# Patient Record
Sex: Female | Born: 1985 | Race: Black or African American | Hispanic: No | Marital: Married | Smoking: Never smoker
Health system: Southern US, Community
[De-identification: ages and names within clinical notes are randomized; demographics above are authoritative.]

## PROBLEM LIST (undated history)

## (undated) DIAGNOSIS — G43909 Migraine, unspecified, not intractable, without status migrainosus: Secondary | ICD-10-CM

## (undated) HISTORY — DX: Migraine, unspecified, not intractable, without status migrainosus: G43.909

## (undated) HISTORY — PX: NO PAST SURGERIES: SHX2092

---

## 2003-02-05 ENCOUNTER — Encounter: Admission: RE | Admit: 2003-02-05 | Discharge: 2003-02-05 | Payer: Self-pay | Admitting: Internal Medicine

## 2003-02-05 ENCOUNTER — Encounter: Payer: Self-pay | Admitting: Internal Medicine

## 2013-08-08 ENCOUNTER — Encounter: Payer: Self-pay | Admitting: Gynecology

## 2013-08-08 ENCOUNTER — Other Ambulatory Visit (HOSPITAL_COMMUNITY)
Admission: RE | Admit: 2013-08-08 | Discharge: 2013-08-08 | Disposition: A | Discharge status: Home / Self Care | Payer: BC Managed Care – PPO | Source: Ambulatory Visit | Attending: Gynecology | Admitting: Gynecology

## 2013-08-08 ENCOUNTER — Ambulatory Visit (INDEPENDENT_AMBULATORY_CARE_PROVIDER_SITE_OTHER): Payer: BC Managed Care – PPO | Admitting: Gynecology

## 2013-08-08 VITALS — BP 118/76 | Ht 63.5 in | Wt 159.0 lb

## 2013-08-08 DIAGNOSIS — Z309 Encounter for contraceptive management, unspecified: Secondary | ICD-10-CM

## 2013-08-08 DIAGNOSIS — R519 Headache, unspecified: Secondary | ICD-10-CM | POA: Insufficient documentation

## 2013-08-08 DIAGNOSIS — Z01419 Encounter for gynecological examination (general) (routine) without abnormal findings: Secondary | ICD-10-CM

## 2013-08-08 DIAGNOSIS — Z1151 Encounter for screening for human papillomavirus (HPV): Secondary | ICD-10-CM | POA: Insufficient documentation

## 2013-08-08 DIAGNOSIS — R51 Headache: Secondary | ICD-10-CM

## 2013-08-08 NOTE — Progress Notes (Signed)
Shirley Jennings 19-May-1985 [id number]   History:    28 y.o.  for annual gyn exam who is a new patient to the practice. The patient stated [facility] She states that she has always had normal Pap smears. She stated several years ago she completed the HPV vaccine series. She is sexually active. She is having normal menstrual cycle. She does suffer from migraines and has seen a neurologist. She infrequently does her self breast examination. Patient with no prior history of STDs. [facility] has been doing her blood work.  Past medical history,surgical history, family history and social history were all reviewed and documented in the EPIC chart.  Gynecologic History Patient's last menstrual period was 08/05/2013. Contraception: OCP (estrogen/progesterone) Last Pap: Over 3 years ago. Results were: normal Last mammogram: Not indicated. Results were: Not indicated  Obstetric History OB History  Gravida Para Term Preterm AB SAB TAB Ectopic Multiple Living  0                  ROS: A ROS was performed and pertinent positives and negatives are included in the history.  GENERAL: No fevers or chills. HEENT: No change in vision, no earache, sore throat or sinus congestion. NECK: No pain or stiffness. CARDIOVASCULAR: No chest pain or pressure. No palpitations. PULMONARY: No shortness of breath, cough or wheeze. GASTROINTESTINAL: No abdominal pain, nausea, vomiting or diarrhea, melena or bright red blood per rectum. GENITOURINARY: No urinary frequency, urgency, hesitancy or dysuria. MUSCULOSKELETAL: No joint or muscle pain, no back pain, no recent trauma. DERMATOLOGIC: No rash, no itching, no lesions. ENDOCRINE: No polyuria, polydipsia, no heat or cold intolerance. No recent change in weight. HEMATOLOGICAL: No anemia or easy bruising or bleeding. NEUROLOGIC: No headache, seizures, numbness, tingling or weakness. PSYCHIATRIC: No  depression, no loss of interest in normal activity or change in sleep pattern.     Exam: chaperone present  BP 118/76  Ht 5' 3.5" (1.613 m)  Wt 159 lb (72.122 kg)  BMI 27.72 kg/m2  LMP 08/05/2013  Body mass index is 27.72 kg/(m^2).  General appearance : Well developed well nourished female. No acute distress HEENT: Neck supple, trachea midline, no carotid bruits, no thyroidmegaly Lungs: Clear to auscultation, no rhonchi or wheezes, or rib retractions  Heart: Regular rate and rhythm, no murmurs or gallops Breast:Examined in sitting and supine position were symmetrical in appearance, no palpable masses or tenderness,  no skin retraction, no nipple inversion, no nipple discharge, no skin discoloration, no axillary or supraclavicular lymphadenopathy Abdomen: no palpable masses or tenderness, no rebound or guarding Extremities: no edema or skin discoloration or tenderness  Pelvic:  Bartholin, Urethra, Skene Glands: Within normal limits             Vagina: No gross lesions or discharge  Cervix: No gross lesions or discharge  Uterus  anteverted, normal size, shape and consistency, non-tender and mobile  Adnexa  Without masses or tenderness  Anus and perineum  normal   Rectovaginal  normal sphincter tone without palpated masses or tenderness             Hemoccult not indicated     Assessment/Plan:  28 y.o. 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 I have recommended that she come off the oral contraceptive pill to prevent stroke with her history of a severe migraines. We discussed alternative form of contraception such  as nonhormonal IUD like ParaGard T380A IUD for which the literature formation was provided. She will wait until her next cycle to start with the IUD meanwhile she will stay using barrier contraception such as a condom and come off the oral contraceptive pill. Her Pap smear was done today. The new guidelines were discussed in reference to Pap  smears every 3 years. Blood work will be drawn by her PCP.  Note: This dictation was prepared with  Dragon/digital dictation along withSmart phrase technology. Any transcriptional errors that result from this process are unintentional.   Ok Edwards MD, 3:53 PM 08/08/2013

## 2013-08-08 NOTE — Patient Instructions (Signed)
Intrauterine Device Insertion   Most often, an intrauterine device (IUD) is inserted into the uterus to prevent pregnancy. There are 2 types of IUDs available:  · Copper IUD This type of IUD creates an environment that is not favorable to sperm survival. The mechanism of action of the copper IUD is not known for certain. It can stay in place for 10 years.  · Hormone IUD This type of IUD contains the hormone progestin (synthetic progesterone). The progestin thickens the cervical mucus and prevents sperm from entering the uterus, and it also thins the uterine lining. There is no evidence that the hormone IUD prevents implantation. One hormone IUD can stay in place for up to 5 years, and a different hormone IUD can stay in place for up to 3 years.  An IUD is the most cost-effective birth control if left in place for the full duration. It may be removed at any time.  LET YOUR HEALTH CARE PROVIDER KNOW ABOUT:  · Any allergies you have.  · All medicines you are taking, including vitamins, herbs, eye drops, creams, and over-the-counter medicines.  · Previous problems you or members of your family have had with the use of anesthetics.  · Any blood disorders you have.  · Previous surgeries you have had.  · Possibility of pregnancy.  · Medical conditions you have.  RISKS AND COMPLICATIONS   Generally, intrauterine device insertion is a safe procedure. However, as with any procedure, complications can occur. Possible complications include:  · Accidental puncture (perforation) of the uterus.  · Accidental placement of the IUD either in the muscle layer of the uterus (myometrium) or outside the uterus. If this happens, the IUD can be found essentially floating around the bowels and must be taken out surgically.  · The IUD may fall out of the uterus (expulsion). This is more common in women who have recently had a child.    · Pregnancy in the fallopian tube (ectopic).  · Pelvic inflammatory disease (PID), which is infection of  the uterus and fallopian tubes. The risk of PID is slightly increased in the first 20 days after the IUD is placed, but the overall risk is still very low.  BEFORE THE PROCEDURE  · Schedule the IUD insertion for when you will have your menstrual period or right after, to make sure you are not pregnant. Placement of the IUD is better tolerated shortly after a menstrual cycle.  · You may need to take tests or be examined to make sure you are not pregnant.  · You may be required to take a pregnancy test.  · You may be required to get checked for sexually transmitted infections (STIs) prior to placement. Placing an IUD in someone who has an infection can make the infection worse.  · You may be given a pain reliever to take 1 or 2 hours before the procedure.  · An exam will be performed to determine the size and position of your uterus.  · Ask your health care provider about changing or stopping your regular medicines.  PROCEDURE   · A tool (speculum) is placed in the vagina. This allows your health care provider to see the lower part of the uterus (cervix).  · The cervix is prepped with a medicine that lowers the risk of infection.  · You may be given a medicine to numb each side of the cervix (intracervical or paracervical block). This is used to block and control any discomfort with insertion.  · A tool (  the cervical canal and into your uterus.  The IUD is placed in the uterine cavity and the insertion device is removed.  The nylon string that is attached to the IUD and used for eventual IUD removal is trimmed. It is trimmed so that it lays high in the vagina, just outside the cervix. AFTER THE PROCEDURE  You may have bleeding after the procedure. This is normal. It varies from light spotting for a few days to menstrual-like  bleeding.  You may have mild cramping. Document Released: 12/30/2010 Document Revised: 02/21/2013 Document Reviewed: 10/22/2012 [facility] Patient Information 2014 [place]. Breast Self-Awareness Practicing breast self-awareness may pick up problems early, prevent significant medical complications, and possibly save your life. By practicing breast self-awareness, you can become familiar with how your breasts look and feel and if your breasts are changing. This allows you to notice changes early. It can also offer you some reassurance that your breast health is good. One way to learn what is normal for your breasts and whether your breasts are changing is to do a breast self-exam. If you find a lump or something that was not present in the past, it is best to contact your caregiver right away. Other findings that should be evaluated by your caregiver include nipple discharge, especially if it is bloody; skin changes or reddening; areas where the skin seems to be pulled in (retracted); or new lumps and bumps. Breast pain is seldom associated with cancer (malignancy), but should also be evaluated by a caregiver. HOW TO PERFORM A BREAST SELF-EXAM The best time to examine your breasts is 5 7 days after your menstrual period is over. During menstruation, the breasts are lumpier, and it may be more difficult to pick up changes. If you do not menstruate, have reached menopause, or had your uterus removed (hysterectomy), you should examine your breasts at regular intervals, such as monthly. If you are breastfeeding, examine your breasts after a feeding or after using a breast pump. Breast implants do not decrease the risk for lumps or tumors, so continue to perform breast self-exams as recommended. Talk to your caregiver about how to determine the difference between the implant and breast tissue. Also, talk about the amount of pressure you should use during the exam. Over time, you will become more familiar  with the variations of your breasts and more comfortable with the exam. A breast self-exam requires you to remove all your clothes above the waist. 1. Look at your breasts and nipples. Stand in front of a mirror in a room with good lighting. With your hands on your hips, push your hands firmly downward. Look for a difference in shape, contour, and size from one breast to the other (asymmetry). Asymmetry includes puckers, dips, or bumps. Also, look for skin changes, such as reddened or scaly areas on the breasts. Look for nipple changes, such as discharge, dimpling, repositioning, or redness. 2. Carefully feel your breasts. This is best done either in the shower or tub while using soapy water or when flat on your back. Place the arm (on the side of the breast you are examining) above your head. Use the pads (not the fingertips) of your three middle fingers on your opposite hand to feel your breasts. Start in the underarm area and use  inch (2 cm) overlapping circles to feel your breast. Use 3 different levels of pressure (light, medium, and firm pressure) at each circle before moving to the next circle. The light pressure is needed to  feel the tissue closest to the skin. The medium pressure will help to feel breast tissue a little deeper, while the firm pressure is needed to feel the tissue close to the ribs. Continue the overlapping circles, moving downward over the breast until you feel your ribs below your breast. Then, move one finger-width towards the center of the body. Continue to use the  inch (2 cm) overlapping circles to feel your breast as you move slowly up toward the collar bone (clavicle) near the base of the neck. Continue the up and down exam using all 3 pressures until you reach the middle of the chest. Do this with each breast, carefully feeling for lumps or changes. 3.  Keep a written record with breast changes or normal findings for each breast. By writing this information down, you do not  need to depend only on memory for size, tenderness, or location. Write down where you are in your menstrual cycle, if you are still menstruating. Breast tissue can have some lumps or thick tissue. However, see your caregiver if you find anything that concerns you.  SEEK MEDICAL CARE IF:  You see a change in shape, contour, or size of your breasts or nipples.   You see skin changes, such as reddened or scaly areas on the breasts or nipples.   You have an unusual discharge from your nipples.   You feel a new lump or unusually thick areas.  Document Released: 05/03/2005 Document Revised: 04/19/2012 Document Reviewed: 08/18/2011 [facility] Patient Information 2014 [place], [place].   Kenna Gilbert

## 2013-08-13 ENCOUNTER — Telehealth: Payer: Self-pay | Admitting: Gynecology

## 2013-08-13 NOTE — Telephone Encounter (Signed)
08/13/13-[facility]aguard and insertion at [facility]%, no copay. She will get back to us if she decides to proceed with this. [facility]

## 2013-12-17 LAB — OB RESULTS CONSOLE HIV ANTIBODY (ROUTINE TESTING): HIV: NONREACTIVE

## 2013-12-17 LAB — OB RESULTS CONSOLE ABO/RH: RH Type: POSITIVE

## 2013-12-17 LAB — OB RESULTS CONSOLE RPR: RPR: NONREACTIVE

## 2013-12-17 LAB — OB RESULTS CONSOLE HEPATITIS B SURFACE ANTIGEN: Hepatitis B Surface Ag: NEGATIVE

## 2013-12-17 LAB — OB RESULTS CONSOLE GC/CHLAMYDIA
CHLAMYDIA, DNA PROBE: NEGATIVE
Gonorrhea: NEGATIVE

## 2013-12-17 LAB — OB RESULTS CONSOLE ANTIBODY SCREEN: Antibody Screen: NEGATIVE

## 2013-12-17 LAB — OB RESULTS CONSOLE RUBELLA ANTIBODY, IGM: Rubella: IMMUNE

## 2014-02-23 ENCOUNTER — Telehealth: Payer: Self-pay | Admitting: Obstetrics and Gynecology

## 2014-02-23 NOTE — Telephone Encounter (Signed)
TC from patient:  18 weeks, was sleeping, got up with full bladder, had sharp pain just above pubis symphysis. Able to void without pain, and pain resolved.  No bleeding, d/c, or any other issue.  Likely due to bladder spasm or round ligament spasm.  Several questions reviewed, including safety of sex during labor, etc.  Patient to push fluids, rest, and call with any recurrence of pain.  Nigel BridgemanVicki Neyland Pettengill, CNM 02/23/14 5P

## 2014-05-04 ENCOUNTER — Telehealth: Payer: Self-pay

## 2014-05-04 NOTE — Telephone Encounter (Signed)
Patient calls reporting N/V and diarrhea since this morning and feels she may have a stomach virus or food poisoning.  Patient reports seating brown rice, honey chicken, and egg wontons from [employer] for dinner yesterday.  Patient had no issues until waking at 0330.  Patient states, at this time, it was "coming out of both ends."  Patient states that she stopped vomiting, but continues to have diarrhea.  Patient denies fever, and chills.  Patient reports nausea and has not attempted any OTC medications.  Patient does report having some zofran from 1st trimester.  Patient instructed to take zofran, continue to hydrate with water and/or gatorade, and attempt to eat crackers/toast.  Patient instructed to gradually advance diet and report back if diarrhea worsens, vomiting resumes, or if other symptoms present.  Patient informed that diarrhea should be resolved in 24 hours and if symptoms remain tomorrow around lunch then to call for possible evaluation.  Patient verbalized understanding and had no other questions or concerns.

## 2014-07-05 LAB — OB RESULTS CONSOLE GBS: GBS: NEGATIVE

## 2014-07-15 ENCOUNTER — Encounter (HOSPITAL_COMMUNITY): Payer: Self-pay | Admitting: *Deleted

## 2014-07-15 ENCOUNTER — Other Ambulatory Visit: Payer: Self-pay | Admitting: Obstetrics and Gynecology

## 2014-07-15 ENCOUNTER — Inpatient Hospital Stay (HOSPITAL_COMMUNITY)
Admission: AD | Admit: 2014-07-15 | Discharge: 2014-07-20 | DRG: 765 | Disposition: A | Discharge status: Home / Self Care | Payer: BC Managed Care – PPO | Source: Ambulatory Visit | Attending: Obstetrics and Gynecology | Admitting: Obstetrics and Gynecology

## 2014-07-15 ENCOUNTER — Inpatient Hospital Stay (HOSPITAL_COMMUNITY): Payer: BC Managed Care – PPO

## 2014-07-15 DIAGNOSIS — IMO0001 Reserved for inherently not codable concepts without codable children: Secondary | ICD-10-CM

## 2014-07-15 DIAGNOSIS — O99354 Diseases of the nervous system complicating childbirth: Secondary | ICD-10-CM | POA: Diagnosis present

## 2014-07-15 DIAGNOSIS — O9962 Diseases of the digestive system complicating childbirth: Secondary | ICD-10-CM | POA: Diagnosis present

## 2014-07-15 DIAGNOSIS — R030 Elevated blood-pressure reading, without diagnosis of hypertension: Secondary | ICD-10-CM

## 2014-07-15 DIAGNOSIS — E876 Hypokalemia: Secondary | ICD-10-CM | POA: Diagnosis present

## 2014-07-15 DIAGNOSIS — O139 Gestational [pregnancy-induced] hypertension without significant proteinuria, unspecified trimester: Secondary | ICD-10-CM | POA: Diagnosis present

## 2014-07-15 DIAGNOSIS — O99284 Endocrine, nutritional and metabolic diseases complicating childbirth: Secondary | ICD-10-CM | POA: Diagnosis present

## 2014-07-15 DIAGNOSIS — K117 Disturbances of salivary secretion: Secondary | ICD-10-CM | POA: Diagnosis present

## 2014-07-15 DIAGNOSIS — G43909 Migraine, unspecified, not intractable, without status migrainosus: Secondary | ICD-10-CM | POA: Diagnosis present

## 2014-07-15 DIAGNOSIS — O133 Gestational [pregnancy-induced] hypertension without significant proteinuria, third trimester: Secondary | ICD-10-CM | POA: Diagnosis present

## 2014-07-15 DIAGNOSIS — Z3A39 39 weeks gestation of pregnancy: Secondary | ICD-10-CM | POA: Diagnosis present

## 2014-07-15 DIAGNOSIS — Z8669 Personal history of other diseases of the nervous system and sense organs: Secondary | ICD-10-CM

## 2014-07-15 DIAGNOSIS — Z98891 History of uterine scar from previous surgery: Secondary | ICD-10-CM

## 2014-07-15 LAB — URINALYSIS, ROUTINE W REFLEX MICROSCOPIC
BILIRUBIN URINE: NEGATIVE
Glucose, UA: NEGATIVE mg/dL
Hgb urine dipstick: NEGATIVE
KETONES UR: NEGATIVE mg/dL
Leukocytes, UA: NEGATIVE
Nitrite: NEGATIVE
PH: 6 (ref 5.0–8.0)
PROTEIN: NEGATIVE mg/dL
Urobilinogen, UA: 0.2 mg/dL (ref 0.0–1.0)

## 2014-07-15 LAB — COMPREHENSIVE METABOLIC PANEL
ALT: 29 U/L (ref 0–35)
AST: 39 U/L — AB (ref 0–37)
Albumin: 2.9 g/dL — ABNORMAL LOW (ref 3.5–5.2)
Alkaline Phosphatase: 188 U/L — ABNORMAL HIGH (ref 39–117)
Anion gap: 4 — ABNORMAL LOW (ref 5–15)
BILIRUBIN TOTAL: 0.5 mg/dL (ref 0.3–1.2)
BUN: 5 mg/dL — ABNORMAL LOW (ref 6–23)
CALCIUM: 8.5 mg/dL (ref 8.4–10.5)
CHLORIDE: 108 mmol/L (ref 96–112)
CO2: 24 mmol/L (ref 19–32)
Creatinine, Ser: 0.58 mg/dL (ref 0.50–1.10)
GFR calc Af Amer: >90 mL/min (ref >90)
GFR calc non Af Amer: >90 mL/min (ref >90)
Glucose, Bld: 88 mg/dL (ref 70–99)
Potassium: 2.9 mmol/L — ABNORMAL LOW (ref 3.5–5.1)
SODIUM: 136 mmol/L (ref 135–145)
Total Protein: 6.1 g/dL (ref 6.0–8.3)

## 2014-07-15 LAB — CBC
HCT: 33.4 % — ABNORMAL LOW (ref 36.0–46.0)
HEMOGLOBIN: 11.3 g/dL — AB (ref 12.0–15.0)
MCH: 30.3 pg (ref 26.0–34.0)
MCHC: 33.8 g/dL (ref 30.0–36.0)
MCV: 89.5 fL (ref 78.0–100.0)
Platelets: 192 10*3/uL (ref 150–400)
RBC: 3.73 MIL/uL — AB (ref 3.87–5.11)
RDW: 14 % (ref 11.5–15.5)
WBC: 10.7 10*3/uL — ABNORMAL HIGH (ref 4.0–10.5)

## 2014-07-15 LAB — TYPE AND SCREEN
ABO/RH(D): O POS
Antibody Screen: NEGATIVE

## 2014-07-15 LAB — PROTEIN / CREATININE RATIO, URINE
Creatinine, Urine: 43 mg/dL
Protein Creatinine Ratio: 0.16 — ABNORMAL HIGH (ref 0.00–0.15)
Total Protein, Urine: 7 mg/dL

## 2014-07-15 MED ORDER — ONDANSETRON HCL 4 MG/2ML IJ SOLN: 4.0000 mg | Freq: Four times a day (QID) | INTRAMUSCULAR | Status: DC | PRN

## 2014-07-15 MED ORDER — TERBUTALINE SULFATE 1 MG/ML IJ SOLN: 0.2500 mg | Freq: Once | INTRAMUSCULAR | Status: AC | PRN

## 2014-07-15 MED ORDER — OXYTOCIN BOLUS FROM INFUSION: 500.0000 mL | INTRAVENOUS | Status: DC

## 2014-07-15 MED ORDER — CITRIC ACID-SODIUM CITRATE 334-500 MG/5ML PO SOLN
30.0000 mL | ORAL | Status: DC | PRN
  Administered 2014-07-18: 30 mL via ORAL
  Filled 2014-07-15: qty 15

## 2014-07-15 MED ORDER — LACTATED RINGERS IV SOLN
INTRAVENOUS | Status: DC
  Administered 2014-07-15 – 2014-07-18 (×4): via INTRAVENOUS

## 2014-07-15 MED ORDER — OXYCODONE-ACETAMINOPHEN 5-325 MG PO TABS: 2.0000 | ORAL_TABLET | ORAL | Status: DC | PRN

## 2014-07-15 MED ORDER — OXYCODONE-ACETAMINOPHEN 5-325 MG PO TABS: 1.0000 | ORAL_TABLET | ORAL | Status: DC | PRN

## 2014-07-15 MED ORDER — ACETAMINOPHEN 325 MG PO TABS: 650.0000 mg | ORAL_TABLET | ORAL | Status: DC | PRN

## 2014-07-15 MED ORDER — MISOPROSTOL 25 MCG QUARTER TABLET
25.0000 ug | ORAL_TABLET | ORAL | Status: DC | PRN
  Administered 2014-07-15 – 2014-07-17 (×4): 25 ug via VAGINAL
  Filled 2014-07-15 (×5): qty 0.25

## 2014-07-15 MED ORDER — OXYTOCIN 40 UNITS IN LACTATED RINGERS INFUSION - SIMPLE MED
62.5000 mL/h | INTRAVENOUS | Status: DC
  Filled 2014-07-15: qty 1000

## 2014-07-15 MED ORDER — BUTORPHANOL TARTRATE 1 MG/ML IJ SOLN
1.0000 mg | INTRAMUSCULAR | Status: DC | PRN
  Administered 2014-07-17 (×2): 1 mg via INTRAVENOUS
  Filled 2014-07-15 (×2): qty 1

## 2014-07-15 MED ORDER — LIDOCAINE HCL (PF) 1 % IJ SOLN: 30.0000 mL | INTRAMUSCULAR | Status: DC | PRN

## 2014-07-15 MED ORDER — LACTATED RINGERS IV SOLN
500.0000 mL | INTRAVENOUS | Status: DC | PRN
  Administered 2014-07-18: 500 mL via INTRAVENOUS

## 2014-07-15 NOTE — Progress Notes (Signed)
Urine PCR 0.16. No need for Magnesium Sulfate at this time.  Shirley ScarletKimberly Quinnley Jennings, CNM 07/15/14, 10:00 PM

## 2014-07-15 NOTE — MAU Provider Note (Signed)
History     CSN: [id number]  Arrival date and time: 07/15/14 1754   First Provider Initiated Contact with Patient 07/15/14 1904      Chief Complaint  Patient presents with  . Hypertension   HPI  Shirley Jennings is a 29 y.o. G1P0 at [id number] who presents today from the office with elevated and increase swelling in her right leg. She states that she started to have elevated blood pressures on Thursday in the office. She states that they were again elevated today. She denies any HA, visual disturbances or RUQ pain. She states that the fetus has been active.   Last drank  at 1600 Last ate salad with dressing at 1330  Past Medical History  Diagnosis Date  . Migraines     Past Surgical History  Procedure Laterality Date  . No past surgeries      Family History  Problem Relation Age of Onset  . Diabetes Maternal Grandmother   . Hypertension Maternal Grandfather     History  Substance Use Topics  . Smoking status: Never Smoker   . Smokeless tobacco: Not on file  . Alcohol Use: No     Comment: WINE    Allergies: No Known Allergies  Prescriptions prior to admission  Medication Sig Dispense Refill Last Dose  . butalbital-aspirin-caffeine (FIORINAL) 50-325-40 MG per capsule Take 1 capsule by mouth 2 (two) times daily as needed for headache.   Taking  . norethindrone-ethinyl estradiol (JUNEL FE,GILDESS FE,LOESTRIN FE) 1-20 MG-MCG tablet Take 1 tablet by mouth daily.   Not Taking    ROS Physical Exam   Blood pressure 143/87, pulse 84, temperature 98.5 F (36.9 C), temperature source Oral, resp. rate 18, last menstrual period 08/05/2013.  Physical Exam  Constitutional: She is oriented to person, place, and time. She appears well-developed and well-nourished. No distress.  Cardiovascular: Normal rate.   Respiratory: Effort normal.  GI: Soft. There is no tenderness.  Musculoskeletal: She exhibits edema. She exhibits no tenderness.  R: 42.5 cm, edema, non-tender, no  erythema. Negative homans  L: 39.5 cm, edema, non-tender, no erythema, negative homans.    Neurological: She is alert and oriented to person, place, and time. She has normal reflexes.  No clonus   Skin: Skin is warm and dry.  Psychiatric: She has a normal mood and affect.    MAU Course  Procedures  Results for orders placed or performed during the hospital encounter of 07/15/14 (from the past 24 hour(s))  Urinalysis, Routine w reflex microscopic     Status: Abnormal   Collection Time: 07/15/14  6:11 PM  Result Value Ref Range   Color, Urine STRAW (A) YELLOW   APPearance HAZY (A) CLEAR   Specific Gravity, Urine <1.005 (L) 1.005 - 1.030   pH 6.0 5.0 - 8.0   Glucose, UA NEGATIVE NEGATIVE mg/dL   Hgb urine dipstick NEGATIVE NEGATIVE   Bilirubin Urine NEGATIVE NEGATIVE   Ketones, ur NEGATIVE NEGATIVE mg/dL   Protein, ur NEGATIVE NEGATIVE mg/dL   Urobilinogen, UA 0.2 0.0 - 1.0 mg/dL   Nitrite NEGATIVE NEGATIVE   Leukocytes, UA NEGATIVE NEGATIVE  CBC     Status: Abnormal   Collection Time: 07/15/14  6:37 PM  Result Value Ref Range   WBC 10.7 (H) 4.0 - 10.5 K/uL   RBC 3.73 (L) 3.87 - 5.11 MIL/uL   Hemoglobin 11.3 (L) 12.0 - 15.0 g/dL   HCT 40.933.4 (L) 81.136.0 - 91.446.0 %   MCV 89.5 78.0 - 100.0 fL  MCH 30.3 26.0 - 34.0 pg   MCHC 33.8 30.0 - 36.0 g/dL   RDW 16.1 09.6 - 04.5 %   Platelets 192 150 - 400 K/uL  Comprehensive metabolic panel     Status: Abnormal   Collection Time: 07/15/14  6:37 PM  Result Value Ref Range   Sodium 136 135 - 145 mmol/L   Potassium 2.9 (L) 3.5 - 5.1 mmol/L   Chloride 108 96 - 112 mmol/L   CO2 24 19 - 32 mmol/L   Glucose, Bld 88 70 - 99 mg/dL   BUN 5 (L) 6 - 23 mg/dL   Creatinine, Ser 4.09 0.50 - 1.10 mg/dL   Calcium 8.5 8.4 - 81.1 mg/dL   Total Protein 6.1 6.0 - 8.3 g/dL   Albumin 2.9 (L) 3.5 - 5.2 g/dL   AST 39 (H) 0 - 37 U/L   ALT 29 0 - 35 U/L   Alkaline Phosphatase 188 (H) 39 - 117 U/L   Total Bilirubin 0.5 0.3 - 1.2 mg/dL   GFR calc non Af Amer  >90 >90 mL/min   GFR calc Af Amer >90 >90 mL/min   Anion gap 4 (L) 5 - 15    1920: Dr. Richardson Dopp on the phone with the RN. She will admit the patient.  Assessment and Plan  Pre-eclampsia Admit to labor and delivery.   Tawnya Crook 07/15/2014, 7:06 PM

## 2014-07-15 NOTE — Progress Notes (Signed)
Patient sent from office for further evaluation due to elevated bp in the office. Her bp in MAU has been as high as 160/107... Given severe range pressures at term will keep for induction.. Plan for cytotec induction overnight.. Plan of care was discussed with the patient and all her questions were answered.. Sign out given to Dr. Dois DavenportSandra Rivard who is covering until 700 am on 07/16/2014.

## 2014-07-15 NOTE — MAU Note (Signed)
snt from office, BP was elevated on Thur, f/u today- still elevated. Denies HA, epigastric pain or visual disturbances.  Increased swelling in lower extr, esp right leg, tight and tender to touch, hurts to walk on it.

## 2014-07-15 NOTE — H&P (Signed)
Zenon MayoSantana T Miller is a 29 y.o. female G1 at 6738 4/7 weeks c/w 10 week ultrasound for a f/u ob visit due to elevated BP.Pt was seen 4 days ago, BP was 136/92, repeat 119/74.  Pt was asymptomatic at that time.  Labs were ordered but pt did not go to lab to have them done.  First BP today was 158/96, repeat 142/96.  Pt denies headaches, SOB, visual changes.  She has c/o right leg pain which thought was due to swelling.  The right leg has been more swollen than the left leg throughout the pregnancy but it recently increased in swelling and has become uncomfortable.   [facility]Dr. Dion BodyVarnado) at 9 weeks.  Pt's pregnancy has been complicated by Nausea/vomiting of pregnancy, ptyallism, H/o migraine headaches.  Pt declined Influenza and Tdap vaccination.  Maternal Medical History:  Reason for admission: Elevated BP  Contractions: Frequency: irregular.   Perceived severity is mild.    Fetal activity: Perceived fetal activity is normal.    Prenatal complications: no prenatal complications Prenatal Complications - Diabetes: none.    OB History    Gravida Para Term Preterm AB TAB SAB Ectopic Multiple Living   0              Past Medical History  Diagnosis Date  . Migraines    No past surgical history on file. Family History: family history includes Diabetes in her maternal grandmother; Hypertension in her maternal grandfather. Social History:  reports that she has never smoked. She does not have any smokeless tobacco history on file. She reports that she drinks alcohol. Her drug history is not on file.   Prenatal Transfer Tool  Maternal Diabetes: No Genetic Screening: Normal Maternal Ultrasounds/Referrals: Normal Fetal Ultrasounds or other Referrals:  None Maternal Substance Abuse:  No Significant Maternal Medications:  None Significant Maternal Lab Results:  Lab values include: Group B Strep negative Other Comments:  Elevated BP-Gest HTN vs. Preeclampsia  Review of  Systems  Eyes:       Neg visual changes.  Respiratory: Negative for shortness of breath.   Cardiovascular: Negative for chest pain.  Musculoskeletal:       Right leg pain, increased swelling  Neurological: Positive for speech change. Negative for headaches.      There were no vitals taken for this visit. Maternal Exam:  Abdomen: Patient reports no abdominal tenderness. Fundal height is 39 cm.   Fetal presentation: vertex  Introitus: Normal vulva. Ferning test: not done.  2 hyperpigmented lesions on right inner thigh, slightly raised  Pelvis: adequate for delivery.   Cervix: Cervix evaluated by digital exam.     Fetal Exam Fetal Monitor Review: Mode: hand-held doppler probe.   Baseline rate: 140s.      Physical Exam  Constitutional: She is oriented to person, place, and time. She appears well-developed and well-nourished. No distress.  HENT:  Head: Atraumatic.  Neck: Normal range of motion.  Cardiovascular: Normal rate.   Respiratory: Effort normal and breath sounds normal.  GI: There is no tenderness.  No RUQ pain  Musculoskeletal: Normal range of motion. She exhibits edema.  Subjective discomfort but no specific calf tenderness.  Neg Homen's sign.  Neurological: She is alert and oriented to person, place, and time.  Skin: Skin is warm and dry.  Right leg taut, increased sheen of leg  Psychiatric: She has a normal mood and affect.    Prenatal labs: ABO, Rh:   Antibody:   Rubella:  RPR:    HBsAg:    HIV:    GBS:   Negative  Assessment/Plan: IUP at 38 4/7 weeks Elevated BP.  Gestational HTN vs. Preeclampsia Right leg pain, edema greater than left and now pain.  H/o trauma in the right leg.  Low-moderate suspicion for DVT.  Sent to MAU for serial BP, labs, NST, ultrasound for EFW, AFI. If any testing abnormal, pt will stay for induction of labor.  Cervix closed, would start with Cytotec for cervical ripening. If BP normalizes, testing reassuring, fetal  status reassuring, IOL tomorrow night. LE doppler to rule out DVT.    Dr. Richardson Dopp on until 7 pm. Dr. Estanislado Pandy on call from 7pm to 7am. Low threshold to admit pt. Diagnosis, work up and management plan d/w pt.  Informed of possible induction tonight.  Geryl Rankins 07/15/2014, 5:49 PM

## 2014-07-15 NOTE — Progress Notes (Signed)
*  Preliminary Results* Right lower extremity venous duplex completed. Right lower extremity is negative for deep vein thrombosis. There is no evidence of right Baker's cyst.  07/15/2014 7:45 PM  Gertie FeyMichelle Kanon Novosel, RVT, RDCS, RDMS

## 2014-07-16 LAB — WET PREP, GENITAL
Clue Cells Wet Prep HPF POC: NONE SEEN
TRICH WET PREP: NONE SEEN
Yeast Wet Prep HPF POC: NONE SEEN

## 2014-07-16 LAB — COMPREHENSIVE METABOLIC PANEL
ALK PHOS: 183 U/L — AB (ref 39–117)
ALT: 28 U/L (ref 0–35)
AST: 38 U/L — ABNORMAL HIGH (ref 0–37)
Albumin: 2.7 g/dL — ABNORMAL LOW (ref 3.5–5.2)
Anion gap: 6 (ref 5–15)
BUN: <5 mg/dL — ABNORMAL LOW (ref 6–23)
CO2: 23 mmol/L (ref 19–32)
Calcium: 8.6 mg/dL (ref 8.4–10.5)
Chloride: 109 mmol/L (ref 96–112)
Creatinine, Ser: 0.58 mg/dL (ref 0.50–1.10)
GFR calc Af Amer: >90 mL/min (ref >90)
GFR calc non Af Amer: >90 mL/min (ref >90)
Glucose, Bld: 72 mg/dL (ref 70–99)
POTASSIUM: 3.2 mmol/L — AB (ref 3.5–5.1)
SODIUM: 138 mmol/L (ref 135–145)
Total Bilirubin: 0.6 mg/dL (ref 0.3–1.2)
Total Protein: 6.2 g/dL (ref 6.0–8.3)

## 2014-07-16 LAB — CBC
HCT: 31.7 % — ABNORMAL LOW (ref 36.0–46.0)
HEMOGLOBIN: 10.7 g/dL — AB (ref 12.0–15.0)
MCH: 30.3 pg (ref 26.0–34.0)
MCHC: 33.8 g/dL (ref 30.0–36.0)
MCV: 89.8 fL (ref 78.0–100.0)
Platelets: 188 10*3/uL (ref 150–400)
RBC: 3.53 MIL/uL — ABNORMAL LOW (ref 3.87–5.11)
RDW: 14.1 % (ref 11.5–15.5)
WBC: 10.7 10*3/uL — ABNORMAL HIGH (ref 4.0–10.5)

## 2014-07-16 LAB — URINALYSIS, DIPSTICK ONLY
BILIRUBIN URINE: NEGATIVE
Glucose, UA: NEGATIVE mg/dL
Hgb urine dipstick: NEGATIVE
Ketones, ur: NEGATIVE mg/dL
LEUKOCYTES UA: NEGATIVE
NITRITE: NEGATIVE
PH: 7 (ref 5.0–8.0)
Protein, ur: NEGATIVE mg/dL
Urobilinogen, UA: 0.2 mg/dL (ref 0.0–1.0)

## 2014-07-16 LAB — ABO/RH: ABO/RH(D): O POS

## 2014-07-16 LAB — RPR: RPR Ser Ql: NONREACTIVE

## 2014-07-16 MED ORDER — POTASSIUM CHLORIDE CRYS ER 20 MEQ PO TBCR
20.0000 meq | EXTENDED_RELEASE_TABLET | Freq: Four times a day (QID) | ORAL | Status: AC
  Administered 2014-07-16: 20 meq via ORAL
  Filled 2014-07-16 (×4): qty 1

## 2014-07-16 MED ORDER — TERBUTALINE SULFATE 1 MG/ML IJ SOLN: 0.2500 mg | Freq: Once | INTRAMUSCULAR | Status: AC | PRN

## 2014-07-16 MED ORDER — ZOLPIDEM TARTRATE 5 MG PO TABS
5.0000 mg | ORAL_TABLET | Freq: Every evening | ORAL | Status: DC | PRN
  Administered 2014-07-16: 5 mg via ORAL
  Filled 2014-07-16: qty 1

## 2014-07-16 MED ORDER — OXYTOCIN 40 UNITS IN LACTATED RINGERS INFUSION - SIMPLE MED
1.0000 m[IU]/min | INTRAVENOUS | Status: DC
  Administered 2014-07-16: 1 m[IU]/min via INTRAVENOUS

## 2014-07-16 NOTE — Progress Notes (Signed)
Subjective: Report received from Katharine LookJ. Ozan, MD.  Reports patient is a 28y.o. G1P0 at 38.5wks for IOL d/t elevated bp.  Patient receiving 2nd round of cytotec for cervical ripening.  Strip and Chart Reviewed.   Objective:  Filed Vitals:   07/16/14 1831 07/16/14 1834 07/16/14 1853 07/16/14 2323  BP: 131/107 174/84 150/90 155/76  Pulse: 77 67 89 78  Temp:    98.4 F (36.9 C)  TempSrc:    Oral  Resp: 20  20 18   Height:      Weight:      SpO2:        FHR: 135 bpm, Min Var,- Decels, -Accels UC: Occasional  Assessment: IUP at 338w5dwks Cat I FT Cervical Ripening Elevated BP  Plan: Nurse instructed to place cytotec Okay for ambien for sleep aide Will continue to monitor Continue other mgmt as ordered  Sabas SousJ. Jasa Dundon, CNM 07/16/2014 11:56 PM

## 2014-07-16 NOTE — Progress Notes (Signed)
Informed Shirley Jennings CNM Patient contracting too Frequently to place Cytotec at 4 hour interval. Advised by [name] to place another Cytotec if contractions space out.

## 2014-07-16 NOTE — Progress Notes (Addendum)
Shirley Jennings is a 29 y.o. G1P0 at 6954w5d  Subjective: Pt with c/o frequent urination, every 20 minutes.  Improved after saline lock.  Right leg feels better if she is up moving.  RN thinks swelling has decreased in the right leg. Denies headache, visual changes.  Starting to feel contractions but not painful.  Objective: BP 140/79 mmHg  Pulse 65  Temp(Src) 98 F (36.7 C) (Oral)  Resp 18  Ht 5\' 3"  (1.6 m)  Wt 81.647 kg (180 lb)  BMI 31.89 kg/m2  SpO2 99%  LMP 08/05/2013     Gen:  NAD, resting quietly, ambulating well Abd:  7 1/2 lbs, vertex Ext:  Swelling of right leg to upper right thigh, no calf tenderness or warmth. Neuro:  Reflexes not illicited.  FHT:  FHR: 140s bpm, variability: moderate,  accelerations:  Present,  decelerations:  Absent UC:   regular, every 2 minutes SVE:   Dilation: Closed Effacement (%): 70 Station: -1 Exam by:: Dr Rico JunkerVarnado Minima change from office, slightly softer.   Labs: Lab Results  Component Value Date   WBC 10.7* 07/15/2014   HGB 11.3* 07/15/2014   HCT 33.4* 07/15/2014   MCV 89.5 07/15/2014   PLT 192 07/15/2014  CMP- AST slightly elevated, K+ 2.9 Pr/Cr ratio 0.16 US- 7 lb 2 oz +/- 17 oz, AFI 7.8 LE Doppler neg for DVT  Assessment / Plan: IUP at 38 5/[redacted] weeks Gestational HTN  BPs now mildly elevated. S/p Cytotec x 1.  Frequent contractions, palpate moderate. Right leg edema, Pain decreasing. Neg DVT. Hypokalemia- Start Kdur 20 meq q 6.  Labor: Observe contractions 1 hour, pt just emptied bladder.  Start low dose Pitocin.  Place Foley bulb this afternoon if cervix opens. Preeclampsia:  No s/sxs of preeclampsia Fetal Wellbeing:  Category I Pain Control:  Labor support without medications I/D:  n/a Anticipated MOD:  NSVD  Shirley Jennings 07/16/2014, 7:16 AM

## 2014-07-16 NOTE — Progress Notes (Signed)
Raelyn NumberSantana T Kortz is a 29 y.o. G1P0 at 4747w5d  Subjective: Pt comfortable.  Feels contractions, slightly stronger.  Denies LOF or VB.   Pt feels very irritated during checks.  Pt had 2 yeast infections antepartum.  Objective: BP 149/90 mmHg  Pulse 77  Temp(Src) 97.7 F (36.5 C) (Oral)  Resp 20  Ht 5\' 3"  (1.6 m)  Wt 81.647 kg (180 lb)  BMI 31.89 kg/m2  SpO2 99%  LMP 08/05/2013      FHT:  Reactive, good variability, no decels UC:   irregular, every 1-3 minutes SVE:   FT/thin/-3, posterior Neuro:  Not illicited. Attempted foley bulb placement with sterile technique but cervix was very posterior.  Os not well viisualized.  White discharge. Labs: Lab Results  Component Value Date   WBC 10.7* 07/15/2014   HGB 11.3* 07/15/2014   HCT 33.4* 07/15/2014   MCV 89.5 07/15/2014   PLT 192 07/15/2014    Assessment / Plan: IUP at 38 5/7 weeks IOL due to Gest HTN  BPs mild to severe range.  Failed foley bulb placement. Right leg edema-Neg lower extremity doppler Hypokalemia on Kdur.   Labor: Contractions with minimal cervical change Preeclampsia:  Repeat labs as BP is trending up.  CBC, CMP, Urine dip for protein. Fetal Wellbeing:  Category I Pain Control:  None required I/D:  n/a Anticipated MOD:  NSVD  Wet prep sent to check for yeast.  Increase pitocin.  Recheck at ~7 pm.  If no change, stop Pitocin, allow pt to eat and attempt Cytotec overnight.  Dr. Charlotta Newtonzan on until 7 pm.  Geryl RankinsVARNADO, Linsey Hirota 07/16/2014, 3:33 PM

## 2014-07-16 NOTE — Progress Notes (Signed)
Shirley Jennings is a 29 y.o. G1P0 at 5035w5d  Subjective: Resting comfortably in bed, no acute complaints.  No headache, no blurry vision, no RUQ pain.  Contractions remain minimal. Objective: BP 131/107 mmHg  Pulse 77  Temp(Src) 98.7 F (37.1 C) (Oral)  Resp 18  Ht 5\' 3"  (1.6 m)  Wt 81.647 kg (180 lb)  BMI 31.89 kg/m2  SpO2 99%  LMP 08/05/2013  BP range: 124-168/68-109 Total I/O In: 262.9 [I.V.:262.9] Out: 500 [Urine:500]  FHT:  140, moderate variability, +accels no decel UC:   irregular, every 1-3 minutes SVE:   FT/90/-3, posterior Ext: 2+ edema R>L, no clonus  Labs:   CBC Latest Ref Rng 07/16/2014 07/15/2014  WBC 4.0 - 10.5 K/uL 10.7(H) 10.7(H)  Hemoglobin 12.0 - 15.0 g/dL 10.7(L) 11.3(L)  Hematocrit 36.0 - 46.0 % 31.7(L) 33.4(L)  Platelets 150 - 400 K/uL 188 192    Results for Shirley NumberBOYD, Kiarah T (MRN [id number]) as of 07/16/2014 18:34  Ref. Range 07/16/2014 16:35  Sodium Latest Range: 135-145 mmol/L 138  Potassium Latest Range: 3.5-5.1 mmol/L 3.2 (L)  Chloride Latest Range: 96-112 mmol/L 109  CO2 Latest Range: 19-32 mmol/L 23  BUN Latest Range: 6-23 mg/dL <5 (L)  Creatinine Latest Range: 0.50-1.10 mg/dL 5.620.58  Calcium Latest Range: 8.4-10.5 mg/dL 8.6  GFR calc non Af Amer Latest Range: >90 mL/min >90  GFR calc Af Amer Latest Range: >90 mL/min >90  Glucose Latest Range: 70-99 mg/dL 72  Anion gap Latest Range: 5-15  6  Alkaline Phosphatase Latest Range: 39-117 U/L 183 (H)  Albumin Latest Range: 3.5-5.2 g/dL 2.7 (L)  AST Latest Range: 0-37 U/L 38 (H)  ALT Latest Range: 0-35 U/L 28  Total Protein Latest Range: 6.0-8.3 g/dL 6.2  Total Bilirubin Latest Range: 0.3-1.2 mg/dL 0.6  WBC Latest Range: 4.0-10.5 K/uL 10.7 (H)  RBC Latest Range: 3.87-5.11 MIL/uL 3.53 (L)  Hemoglobin Latest Range: 12.0-15.0 g/dL 13.010.7 (L)  HCT Latest Range: 36.0-46.0 % 31.7 (L)  MCV Latest Range: 78.0-100.0 fL 89.8  MCH Latest Range: 26.0-34.0 pg 30.3  MCHC Latest Range: 30.0-36.0 g/dL 86.533.8  RDW Latest  Range: 11.5-15.5 % 14.1  Platelets Latest Range: 150-400 K/uL 188    Assessment / Plan: 28yo G1P0@[redacted]w[redacted]d  admitted for IOL due to gestational HTN  FWB- Cat. I Labor: No further dilation, will discontinue Pitocin.  Pt to shower and eat and plan for cytotec per protocol this evening Gestational HTN- BP variable, but no IV medications indicated.  Labs remain stable, no evidence of preeclampsia.  Will continue to monitor signs/symptoms.  Edema: unchanged Vaginal irritation: negative for yeast Hypokalemia- continue Kdur with IV fluids  CCOB covering this evening, will sign out to Dr. Normand Sloopillard @ 7pm.  Myna HidalgoJennifer Deundra Furber, DO [phone] (pager) [phone] (office)

## 2014-07-17 ENCOUNTER — Encounter (HOSPITAL_COMMUNITY): Payer: Self-pay | Admitting: Anesthesiology

## 2014-07-17 ENCOUNTER — Inpatient Hospital Stay (HOSPITAL_COMMUNITY): Payer: BC Managed Care – PPO | Admitting: Anesthesiology

## 2014-07-17 ENCOUNTER — Encounter (HOSPITAL_COMMUNITY)
Admission: AD | Disposition: A | Discharge status: Home / Self Care | Payer: Self-pay | Source: Ambulatory Visit | Attending: Obstetrics and Gynecology

## 2014-07-17 LAB — CBC
HCT: 31.9 % — ABNORMAL LOW (ref 36.0–46.0)
Hemoglobin: 10.9 g/dL — ABNORMAL LOW (ref 12.0–15.0)
MCH: 30.5 pg (ref 26.0–34.0)
MCHC: 34.2 g/dL (ref 30.0–36.0)
MCV: 89.4 fL (ref 78.0–100.0)
PLATELETS: 175 10*3/uL (ref 150–400)
RBC: 3.57 MIL/uL — AB (ref 3.87–5.11)
RDW: 14 % (ref 11.5–15.5)
WBC: 10.9 10*3/uL — AB (ref 4.0–10.5)

## 2014-07-17 SURGERY — Surgical Case
Anesthesia: Regional

## 2014-07-17 MED ORDER — HYDRALAZINE HCL 20 MG/ML IJ SOLN: 5.0000 mg | INTRAMUSCULAR | Status: DC | PRN

## 2014-07-17 MED ORDER — BUTORPHANOL TARTRATE 1 MG/ML IJ SOLN: 1.0000 mg | Freq: Once | INTRAMUSCULAR | Status: AC | PRN

## 2014-07-17 MED ORDER — DIPHENHYDRAMINE HCL 50 MG/ML IJ SOLN: 12.5000 mg | INTRAMUSCULAR | Status: DC | PRN

## 2014-07-17 MED ORDER — SCOPOLAMINE 1 MG/3DAYS TD PT72
MEDICATED_PATCH | TRANSDERMAL | Status: AC
  Filled 2014-07-17: qty 1

## 2014-07-17 MED ORDER — OXYTOCIN 40 UNITS IN LACTATED RINGERS INFUSION - SIMPLE MED
1.0000 m[IU]/min | INTRAVENOUS | Status: DC
  Administered 2014-07-17: 1 m[IU]/min via INTRAVENOUS
  Filled 2014-07-17: qty 1000

## 2014-07-17 MED ORDER — LIDOCAINE-EPINEPHRINE (PF) 2 %-1:200000 IJ SOLN
INTRAMUSCULAR | Status: AC
  Filled 2014-07-17: qty 20

## 2014-07-17 MED ORDER — EPHEDRINE 5 MG/ML INJ: 10.0000 mg | INTRAVENOUS | Status: DC | PRN

## 2014-07-17 MED ORDER — OXYTOCIN 10 UNIT/ML IJ SOLN
INTRAMUSCULAR | Status: AC
  Filled 2014-07-17: qty 4

## 2014-07-17 MED ORDER — FENTANYL 2.5 MCG/ML BUPIVACAINE 1/10 % EPIDURAL INFUSION (WH - ANES)
14.0000 mL/h | INTRAMUSCULAR | Status: DC | PRN
  Administered 2014-07-17 – 2014-07-18 (×2): 14 mL/h via EPIDURAL
  Filled 2014-07-17: qty 125

## 2014-07-17 MED ORDER — FENTANYL 2.5 MCG/ML BUPIVACAINE 1/10 % EPIDURAL INFUSION (WH - ANES)
INTRAMUSCULAR | Status: AC
  Administered 2014-07-17 (×2): 14 mL/h via EPIDURAL
  Filled 2014-07-17: qty 125

## 2014-07-17 MED ORDER — SODIUM BICARBONATE 8.4 % IV SOLN
INTRAVENOUS | Status: AC
  Filled 2014-07-17: qty 50

## 2014-07-17 MED ORDER — LACTATED RINGERS IV SOLN: 500.0000 mL | Freq: Once | INTRAVENOUS | Status: DC

## 2014-07-17 MED ORDER — PHENYLEPHRINE 40 MCG/ML (10ML) SYRINGE FOR IV PUSH (FOR BLOOD PRESSURE SUPPORT): 80.0000 ug | PREFILLED_SYRINGE | INTRAVENOUS | Status: DC | PRN

## 2014-07-17 MED ORDER — LIDOCAINE HCL (PF) 1 % IJ SOLN
INTRAMUSCULAR | Status: DC | PRN
  Administered 2014-07-17 (×2): 5 mL

## 2014-07-17 MED ORDER — MORPHINE SULFATE 0.5 MG/ML IJ SOLN
INTRAMUSCULAR | Status: AC
  Filled 2014-07-17: qty 10

## 2014-07-17 MED ORDER — OXYTOCIN 40 UNITS IN LACTATED RINGERS INFUSION - SIMPLE MED: 1.0000 m[IU]/min | INTRAVENOUS | Status: DC

## 2014-07-17 MED ORDER — ONDANSETRON HCL 4 MG/2ML IJ SOLN
INTRAMUSCULAR | Status: AC
  Filled 2014-07-17: qty 2

## 2014-07-17 MED ORDER — PHENYLEPHRINE 40 MCG/ML (10ML) SYRINGE FOR IV PUSH (FOR BLOOD PRESSURE SUPPORT)
80.0000 ug | PREFILLED_SYRINGE | INTRAVENOUS | Status: DC | PRN
  Filled 2014-07-17: qty 20

## 2014-07-17 MED ORDER — PHENYLEPHRINE 40 MCG/ML (10ML) SYRINGE FOR IV PUSH (FOR BLOOD PRESSURE SUPPORT)
PREFILLED_SYRINGE | INTRAVENOUS | Status: AC
  Filled 2014-07-17: qty 20

## 2014-07-17 MED ORDER — CEFAZOLIN SODIUM-DEXTROSE 2-3 GM-% IV SOLR
INTRAVENOUS | Status: AC
  Filled 2014-07-17: qty 50

## 2014-07-17 SURGICAL SUPPLY — 36 items
BARRIER ADHS 3X4 INTERCEED (GAUZE/BANDAGES/DRESSINGS) IMPLANT
BENZOIN TINCTURE PRP APPL 2/3 (GAUZE/BANDAGES/DRESSINGS) IMPLANT
CLAMP CORD UMBIL (MISCELLANEOUS) IMPLANT
CLOSURE WOUND 1/2 X4 (GAUZE/BANDAGES/DRESSINGS)
CLOTH BEACON ORANGE TIMEOUT ST (SAFETY) IMPLANT
DRAPE SHEET LG 3/4 BI-LAMINATE (DRAPES) IMPLANT
DRSG OPSITE POSTOP 4X10 (GAUZE/BANDAGES/DRESSINGS) IMPLANT
DURAPREP 26ML APPLICATOR (WOUND CARE) IMPLANT
ELECT REM PT RETURN 9FT ADLT (ELECTROSURGICAL)
ELECTRODE REM PT RTRN 9FT ADLT (ELECTROSURGICAL) IMPLANT
EXTRACTOR VACUUM BELL STYLE (SUCTIONS) IMPLANT
GLOVE BIO SURGEON STRL SZ7 (GLOVE) IMPLANT
GLOVE BIOGEL PI IND STRL 7.0 (GLOVE) IMPLANT
GLOVE BIOGEL PI INDICATOR 7.0 (GLOVE)
GOWN STRL REUS W/TWL LRG LVL3 (GOWN DISPOSABLE) IMPLANT
KIT ABG SYR 3ML LUER SLIP (SYRINGE) IMPLANT
LIQUID BAND (GAUZE/BANDAGES/DRESSINGS) IMPLANT
NEEDLE HYPO 25X5/8 SAFETYGLIDE (NEEDLE) IMPLANT
NS IRRIG 1000ML POUR BTL (IV SOLUTION) IMPLANT
PACK C SECTION WH (CUSTOM PROCEDURE TRAY) IMPLANT
PAD OB MATERNITY 4.3X12.25 (PERSONAL CARE ITEMS) IMPLANT
RTRCTR C-SECT PINK 25CM LRG (MISCELLANEOUS) IMPLANT
STRIP CLOSURE SKIN 1/2X4 (GAUZE/BANDAGES/DRESSINGS) IMPLANT
SUT CHROMIC 0 CTX 36 (SUTURE) IMPLANT
SUT PLAIN 2 0 (SUTURE)
SUT PLAIN 2 0 XLH (SUTURE) IMPLANT
SUT PLAIN ABS 2-0 54XMFL TIE (SUTURE) IMPLANT
SUT VIC AB 0 CT1 27 (SUTURE)
SUT VIC AB 0 CT1 27XBRD ANBCTR (SUTURE) IMPLANT
SUT VIC AB 0 CTX 36 (SUTURE)
SUT VIC AB 0 CTX36XBRD ANBCTRL (SUTURE) IMPLANT
SUT VIC AB 2-0 CT1 27 (SUTURE)
SUT VIC AB 2-0 CT1 TAPERPNT 27 (SUTURE) IMPLANT
SUT VIC AB 4-0 KS 27 (SUTURE) IMPLANT
TOWEL OR 17X24 6PK STRL BLUE (TOWEL DISPOSABLE) IMPLANT
TRAY FOLEY CATH 14FR (SET/KITS/TRAYS/PACK) IMPLANT

## 2014-07-17 NOTE — Progress Notes (Addendum)
Patient ID: Shirley Jennings, female   DOB: 1986-04-20, 29 y.o.   MRN: [id number]  Patient requested to talk to me about about doing a cesarean delivery at this time as she is getting tired.  Over the phone we discussed that cesarean delivery is not indicated at this time as I am concerned about the risks of an elective primary cesarean section and I advised continuing with induction of labor and a vaginal delivery are safer options. We discussed options to help her rest such as sleeping agents and pain control.  Through her sister on the phone, patient requested an epidural.  She may have an epidural now and continue with pitocin titration.All questions answered. Last cervical exam: 1/100%/0 per [facility]. EFM shows normal baseline, mod variability, reactive. TOCO: CTX Q 2- 3 mins.  Filed Vitals:   07/17/14 1839 07/17/14 1928 07/17/14 2004 07/17/14 2011  BP: 147/80  160/84 154/71  Pulse: 71  75 75  Temp:  98 F (36.7 C)    TempSrc:  Axillary    Resp: 20 20    Height:      Weight:      SpO2:       Dr. Sallye OberKulwa.

## 2014-07-17 NOTE — Anesthesia Procedure Notes (Signed)
Epidural Patient location during procedure: OB Start time: 07/17/2014 10:04 PM  Staffing Anesthesiologist: Brayton CavesJACKSON, Stryder Poitra Performed by: anesthesiologist   Preanesthetic Checklist Completed: patient identified, site marked, surgical consent, pre-op evaluation, timeout performed, IV checked, risks and benefits discussed and monitors and equipment checked  Epidural Patient position: sitting Prep: site prepped and draped and DuraPrep Patient monitoring: continuous pulse ox and blood pressure Approach: midline Location: L3-L4 Injection technique: LOR air  Needle:  Needle type: Tuohy  Needle gauge: 17 G Needle length: 9 cm and 9 Needle insertion depth: 7 cm Catheter type: closed end flexible Catheter size: 19 Gauge Catheter at skin depth: 11 cm Test dose: negative  Assessment Events: blood not aspirated, injection not painful, no injection resistance, negative IV test and no paresthesia  Additional Notes Patient identified.  Risk benefits discussed including failed block, incomplete pain control, headache, nerve damage, paralysis, blood pressure changes, nausea, vomiting, reactions to medication both toxic or allergic, and postpartum back pain.  Patient expressed understanding and wished to proceed.  All questions were answered.  Sterile technique used throughout procedure and epidural site dressed with sterile barrier dressing. No paresthesia or other complications noted.The patient did not experience any signs of intravascular injection such as tinnitus or metallic taste in mouth nor signs of intrathecal spread such as rapid motor block. Please see nursing notes for vital signs.

## 2014-07-17 NOTE — Anesthesia Preprocedure Evaluation (Signed)

## 2014-07-17 NOTE — Progress Notes (Signed)
Shirley Jennings is a 29 y.o. G1P0 at 5060w6d  admitted for induction of labor due to Gestational HTN.   Subjective: Pt has been leaking since ~ 1330.  Blood tinged fluid.  Reports significant pain with contractions, request pain relief.   Per RN, fern negative x 2 but fluid does not smell like urine. Objective: BP 147/80 mmHg  Pulse 71  Temp(Src) 98.9 F (37.2 C) (Oral)  Resp 20  Ht 5\' 3"  (1.6 m)  Wt 81.647 kg (180 lb)  BMI 31.89 kg/m2  SpO2 99%  LMP 08/05/2013 I/O last 3 completed shifts: In: 330.4 [I.V.:330.4] Out: 1800 [Urine:1800]   Pitocin 4 mUs  Gen:  Pt with mod to severe pain with contractions. FHT:  FHR: 130s bpm, variability: moderate,  accelerations:  Present,  decelerations:  Absent UC:   regular, every 1-3 minutes SVE:   Dilation: Fingertip Effacement (%): 90 Station: -2 Exam by:: Dr. Dion Jennings Cervix is now anterior, Head well applied to cervix. Fluid at perineum, running down .  Fern negative  Labs: Lab Results  Component Value Date   WBC 10.9* 07/17/2014   HGB 10.9* 07/17/2014   HCT 31.9* 07/17/2014   MCV 89.4 07/17/2014   PLT 175 07/17/2014    Assessment / Plan: IUP at 38 6/[redacted] weeks Gestational HTN, mild range BPs Clinically ruptured but fern negative. Continue Pitocin. Hydralazine if severe range BPs.  Labor: Signs of labor present.  Cancel cesarean section. Preeclampsia:  no signs or symptoms of toxicity Fetal Wellbeing:  Category I Pain Control:  Epidural and Stadol I/D:  Suspect ROM Anticipated MOD:  NSVD  Shirley Jennings 07/17/2014, 6:44 PM

## 2014-07-17 NOTE — Progress Notes (Signed)
Pt without complaints.  No pain with contractions.  Pt awoke and felt like she urinated.  No continuous leaking.  Requests cesarean section. Afebrile. 140-150s 80-90s Gen:  NAD Perinuem dry CVX previously unchanged, FT, posterior Cat 1 tracing.  Contractions q 1-2 min. A/ IUP at 38 6/7 weeks IOL due to [facility]HT[facility].Mild range BPs R leg edema, swelling decreased since admission.  Defer Cytotec due to frequent contractions. Start Pitocin 1 x 1 mUnits. Continue to observe for possible ROM. If no signs of active labor at ~1730, proceed with cesarean section for failed induction.  Pt agreeable.

## 2014-07-17 NOTE — Progress Notes (Addendum)
  Subjective: Received report from Dr. Dion BodyVarnado and assumed care of pt at 19:00. Briefly, pt is a 29 yo G1P0 @ 38.6 wks who was admitted on 07/15/14 for IOL due to Gestational HTN. Preeclampsia w/u neg. Denies h/a, visual changes, RUQ pain, N/V, CP, SOB or weakness. Reports active fetus and leaking of fluid. Last received Stadol at 19:23; states no relief. Requests c-section due to being "tired and told different things" regarding progress.   Objective: BP 154/71 mmHg  Pulse 75  Temp(Src) 98 F (36.7 C) (Axillary)  Resp 20  Ht 5\' 3"  (1.6 m)  Wt 180 lb (81.647 kg)  BMI 31.89 kg/m2  SpO2 99%  LMP 08/05/2013 I/O last 3 completed shifts: In: 330.4 [I.V.:330.4] Out: 1800 [Urine:1800]   Today's Vitals   07/17/14 1923 07/17/14 1928 07/17/14 2004 07/17/14 2011  BP:   160/84 154/71  Pulse:   75 75  Temp:  98 F (36.7 C)    TempSrc:  Axillary    Resp:  20    Height:      Weight:      SpO2:      PainSc: 7   7     Lungs: CTAB CV: RRR Abdomen: NT, palpates soft between ctxs Ext: 2+ pitting edema, R>L; non tender to palpation DTRs: 1+ bilaterally, no clonus FHT: BL 130 w/ moderate variability, +accels, no decels UC:   irregular, every 1-3 minutes SVE: 1/100/-1 @ 20:36 pm; no BOW palpated, head well applied to cvx; suspected SROM at 15:30 Pitocin at 10 mius/min, reduced to 9 mius/min due to tachysystole IUPC placed w/o difficulty  Assessment:  IUP at 38.6 wks IOL due to Gestational HTN GBS neg Cat 1 FHRT SROM  Plan: Had long discussion w/ pt, spouse and siblings re: current cervical findings and purpose of IUPC. Explained that now that IUPC was in place, would be able to determine strength of contractions and safely increase Pitocin. Pt still requested a c-section despite this discussion. Informed that I would contact Dr. Sallye OberKulwa and relay her wishes for a c-section. Dr. Sallye OberKulwa notified at ~ 21:15. See her note re: discussion w/ pt and sister.     Sherre ScarletWILLIAMS, Donaven Criswell CNM 07/17/2014,  9:57 PM

## 2014-07-17 NOTE — Progress Notes (Signed)
Called for update on pt status, will be in to re eval pt.

## 2014-07-17 NOTE — Anesthesia Preprocedure Evaluation (Signed)
Anesthesia Evaluation  Patient identified by MRN, date of birth, ID band Patient awake    Reviewed: Allergy & Precautions, H&P , Patient's Chart, lab work & pertinent test results  Airway Mallampati: II  TM Distance: >3 FB Neck ROM: full    Dental no notable dental hx.    Pulmonary  breath sounds clear to auscultation  Pulmonary exam normal       Cardiovascular Exercise Tolerance: Good hypertension, Rhythm:regular Rate:Normal     Neuro/Psych    GI/Hepatic   Endo/Other    Renal/GU      Musculoskeletal   Abdominal   Peds  Hematology   Anesthesia Other Findings   Reproductive/Obstetrics                             Anesthesia Physical  Anesthesia Plan  ASA: III  Anesthesia Plan: Epidural   Post-op Pain Management:    Induction:   Airway Management Planned:   Additional Equipment:   Intra-op Plan:   Post-operative Plan:   Informed Consent: I have reviewed the patients History and Physical, chart, labs and discussed the procedure including the risks, benefits and alternatives for the proposed anesthesia with the patient or authorized representative who has indicated his/her understanding and acceptance.   Dental Advisory Given  Plan Discussed with: CRNA  Anesthesia Plan Comments: (Lab work confirmed with CRNA in room. Platelets okay. Discussed spinal anesthetic, and patient consents to the procedure:  included risk of possible headache,backache, failed block, allergic reaction, and nerve injury. This patient was asked if she had any questions or concerns before the procedure started. )        Anesthesia Quick Evaluation

## 2014-07-17 NOTE — Progress Notes (Signed)
Overnight, 2 doses of Cytotec administered.  Labs normal. Urine dip negative for protein.  Pt with complaints.  No contractions.  No headaches, RUQ pain, visual changes.  Right leg swelling same, denies calf tenderness.  VS 140-150s/90s UOP normal  Gen:  Pt resting quietly Abd:  No RUQ tenderness Cvx posterior. FT. EM:  Reactive no decels.  Contractions q 5 min  Cytotec placed vaginally.  A/ IUP at 38 6/7 weeks, Gest HTN.  Neg urine protein. BPs mildly elevated. Right leg edema, stable. Neg LE doppler. Cat 1 tracing. GBS negative.  P/ Continue IOL with Cytotec. Attempt foley bulb placement in 4 hours if dilated. Ambulation in halls of pt desires.

## 2014-07-17 NOTE — Progress Notes (Signed)
  Subjective: In to check on pt s/p epidural. Expresses gratitude for epidural and reports feeling much better. No longer desiring c-section at present.   Objective: BP 158/94 mmHg  Pulse 66  Temp(Src) 98 F (36.7 C) (Axillary)  Resp 20  Ht 5\' 3"  (1.6 m)  Wt 180 lb (81.647 kg)  BMI 31.89 kg/m2  SpO2 100%  LMP 08/05/2013 I/O last 3 completed shifts: In: 330.4 [I.V.:330.4] Out: 1800 [Urine:1800]   Today's Vitals   07/17/14 2225 07/17/14 2230 07/17/14 2235 07/17/14 2240  BP: 163/92 163/97 163/93 158/94  Pulse: 68 65 69 66  Temp:      TempSrc:      Resp:      Height:      Weight:      SpO2: 100% 100% 100% 100%  PainSc:      Has not required IV Hydralazine coverage  FHT: BL 130 w/ min variability, no accels, no decels (suspect sleep cycle) UC:   Regular, every 2 minutes, MVUs 200 per RN SVE: Deferred    Pitocin reduced to 7 mius/min  Assessment:  IOL due to Gestational HTN Stable BPs; no s/s of preeclampsia Overall reactive tracing Pain free  Plan: Advised rest Continue w/ current plan Consult prn  Sherre ScarletWILLIAMS, Birgitta Uhlir CNM 07/17/2014, 10:40 PM

## 2014-07-18 ENCOUNTER — Encounter (HOSPITAL_COMMUNITY)
Admission: AD | Disposition: A | Discharge status: Home / Self Care | Payer: Self-pay | Source: Ambulatory Visit | Attending: Obstetrics and Gynecology

## 2014-07-18 ENCOUNTER — Encounter (HOSPITAL_COMMUNITY): Payer: Self-pay | Admitting: Registered Nurse

## 2014-07-18 LAB — CBC
HCT: 33.7 % — ABNORMAL LOW (ref 36.0–46.0)
HEMATOCRIT: 31.6 % — AB (ref 36.0–46.0)
HEMATOCRIT: 30.7 % — AB (ref 36.0–46.0)
HEMOGLOBIN: 10.8 g/dL — AB (ref 12.0–15.0)
HEMOGLOBIN: 10.4 g/dL — AB (ref 12.0–15.0)
Hemoglobin: 11.5 g/dL — ABNORMAL LOW (ref 12.0–15.0)
MCH: 30.4 pg (ref 26.0–34.0)
MCH: 30.4 pg (ref 26.0–34.0)
MCH: 30.4 pg (ref 26.0–34.0)
MCHC: 34.1 g/dL (ref 30.0–36.0)
MCHC: 34.2 g/dL (ref 30.0–36.0)
MCHC: 33.9 g/dL (ref 30.0–36.0)
MCV: 89.2 fL (ref 78.0–100.0)
MCV: 89 fL (ref 78.0–100.0)
MCV: 89.8 fL (ref 78.0–100.0)
Platelets: 178 10*3/uL (ref 150–400)
Platelets: 173 10*3/uL (ref 150–400)
Platelets: 166 10*3/uL (ref 150–400)
RBC: 3.78 MIL/uL — ABNORMAL LOW (ref 3.87–5.11)
RBC: 3.55 MIL/uL — AB (ref 3.87–5.11)
RBC: 3.42 MIL/uL — AB (ref 3.87–5.11)
RDW: 14.1 % (ref 11.5–15.5)
RDW: 13.9 % (ref 11.5–15.5)
RDW: 14.2 % (ref 11.5–15.5)
WBC: 14.8 10*3/uL — ABNORMAL HIGH (ref 4.0–10.5)
WBC: 15.5 10*3/uL — AB (ref 4.0–10.5)
WBC: 17.4 10*3/uL — AB (ref 4.0–10.5)

## 2014-07-18 LAB — COMPREHENSIVE METABOLIC PANEL
ALBUMIN: 2.5 g/dL — AB (ref 3.5–5.2)
ALK PHOS: 197 U/L — AB (ref 39–117)
ALT: 26 U/L (ref 0–35)
ALT: 22 U/L (ref 0–35)
ANION GAP: 11 (ref 5–15)
AST: 35 U/L (ref 0–37)
AST: 33 U/L (ref 0–37)
Albumin: 2.8 g/dL — ABNORMAL LOW (ref 3.5–5.2)
Alkaline Phosphatase: 180 U/L — ABNORMAL HIGH (ref 39–117)
Anion gap: 10 (ref 5–15)
BUN: 5 mg/dL — AB (ref 6–23)
BUN: 7 mg/dL (ref 6–23)
CALCIUM: 8.7 mg/dL (ref 8.4–10.5)
CALCIUM: 8.2 mg/dL — AB (ref 8.4–10.5)
CO2: 21 mmol/L (ref 19–32)
CO2: 21 mmol/L (ref 19–32)
CREATININE: 0.87 mg/dL (ref 0.50–1.10)
Chloride: 106 mmol/L (ref 96–112)
Chloride: 106 mmol/L (ref 96–112)
Creatinine, Ser: 1.38 mg/dL — ABNORMAL HIGH (ref 0.50–1.10)
GFR calc Af Amer: 59 mL/min — ABNORMAL LOW (ref >90)
GFR calc non Af Amer: 89 mL/min — ABNORMAL LOW (ref >90)
GFR calc non Af Amer: 51 mL/min — ABNORMAL LOW (ref >90)
GLUCOSE: 116 mg/dL — AB (ref 70–99)
Glucose, Bld: 103 mg/dL — ABNORMAL HIGH (ref 70–99)
POTASSIUM: 3 mmol/L — AB (ref 3.5–5.1)
Potassium: 3.1 mmol/L — ABNORMAL LOW (ref 3.5–5.1)
Sodium: 138 mmol/L (ref 135–145)
Sodium: 137 mmol/L (ref 135–145)
TOTAL PROTEIN: 6.5 g/dL (ref 6.0–8.3)
Total Bilirubin: 0.5 mg/dL (ref 0.3–1.2)
Total Bilirubin: 0.6 mg/dL (ref 0.3–1.2)
Total Protein: 5.2 g/dL — ABNORMAL LOW (ref 6.0–8.3)

## 2014-07-18 LAB — PROTEIN / CREATININE RATIO, URINE
Creatinine, Urine: 46 mg/dL
Creatinine, Urine: 39 mg/dL
PROTEIN CREATININE RATIO: 0.24 — AB (ref 0.00–0.15)
PROTEIN CREATININE RATIO: 0.18 — AB (ref 0.00–0.15)
TOTAL PROTEIN, URINE: 11 mg/dL
Total Protein, Urine: 7 mg/dL

## 2014-07-18 LAB — TYPE AND SCREEN
ABO/RH(D): O POS
ANTIBODY SCREEN: NEGATIVE

## 2014-07-18 LAB — BASIC METABOLIC PANEL
ANION GAP: 6 (ref 5–15)
BUN: 6 mg/dL (ref 6–23)
CO2: 24 mmol/L (ref 19–32)
Calcium: 7.9 mg/dL — ABNORMAL LOW (ref 8.4–10.5)
Chloride: 109 mmol/L (ref 96–112)
Creatinine, Ser: 1.05 mg/dL (ref 0.50–1.10)
GFR, EST AFRICAN AMERICAN: 82 mL/min — AB (ref >90)
GFR, EST NON AFRICAN AMERICAN: 71 mL/min — AB (ref >90)
Glucose, Bld: 140 mg/dL — ABNORMAL HIGH (ref 70–99)
POTASSIUM: 3.2 mmol/L — AB (ref 3.5–5.1)
SODIUM: 139 mmol/L (ref 135–145)

## 2014-07-18 LAB — LACTATE DEHYDROGENASE: LDH: 200 U/L (ref 94–250)

## 2014-07-18 LAB — URIC ACID
Uric Acid, Serum: 5.4 mg/dL (ref 2.4–7.0)
Uric Acid, Serum: 5.7 mg/dL (ref 2.4–7.0)

## 2014-07-18 SURGERY — Surgical Case
Anesthesia: Epidural

## 2014-07-18 MED ORDER — CEFAZOLIN SODIUM-DEXTROSE 2-3 GM-% IV SOLR
2.0000 g | INTRAVENOUS | Status: AC
  Administered 2014-07-18: 2 g via INTRAVENOUS
  Filled 2014-07-18: qty 50

## 2014-07-18 MED ORDER — ONDANSETRON HCL 4 MG/2ML IJ SOLN
INTRAMUSCULAR | Status: AC
  Filled 2014-07-18: qty 2

## 2014-07-18 MED ORDER — KETOROLAC TROMETHAMINE 30 MG/ML IJ SOLN
INTRAMUSCULAR | Status: AC
  Administered 2014-07-18: 30 mg via INTRAVENOUS
  Filled 2014-07-18: qty 1

## 2014-07-18 MED ORDER — SIMETHICONE 80 MG PO CHEW
80.0000 mg | CHEWABLE_TABLET | ORAL | Status: DC
  Administered 2014-07-18 – 2014-07-20 (×2): 80 mg via ORAL
  Filled 2014-07-18 (×2): qty 1

## 2014-07-18 MED ORDER — NALOXONE HCL 1 MG/ML IJ SOLN: 1.0000 ug/kg/h | INTRAVENOUS | Status: DC | PRN

## 2014-07-18 MED ORDER — NALBUPHINE HCL 10 MG/ML IJ SOLN
INTRAMUSCULAR | Status: AC
  Administered 2014-07-18: 5 mg via SUBCUTANEOUS
  Filled 2014-07-18: qty 1

## 2014-07-18 MED ORDER — LACTATED RINGERS IV SOLN: INTRAVENOUS | Status: DC

## 2014-07-18 MED ORDER — NALOXONE HCL 0.4 MG/ML IJ SOLN: 0.4000 mg | INTRAMUSCULAR | Status: DC | PRN

## 2014-07-18 MED ORDER — SCOPOLAMINE 1 MG/3DAYS TD PT72
MEDICATED_PATCH | TRANSDERMAL | Status: AC
  Filled 2014-07-18: qty 1

## 2014-07-18 MED ORDER — DIBUCAINE 1 % RE OINT: 1.0000 "application " | TOPICAL_OINTMENT | RECTAL | Status: DC | PRN

## 2014-07-18 MED ORDER — ONDANSETRON HCL 4 MG PO TABS: 8.0000 mg | ORAL_TABLET | Freq: Every day | ORAL | Status: DC | PRN

## 2014-07-18 MED ORDER — SCOPOLAMINE 1 MG/3DAYS TD PT72
1.0000 | MEDICATED_PATCH | Freq: Once | TRANSDERMAL | Status: DC
  Filled 2014-07-18: qty 1

## 2014-07-18 MED ORDER — POTASSIUM CHLORIDE CRYS ER 20 MEQ PO TBCR
20.0000 meq | EXTENDED_RELEASE_TABLET | Freq: Four times a day (QID) | ORAL | Status: DC
  Filled 2014-07-18 (×3): qty 1

## 2014-07-18 MED ORDER — ONDANSETRON HCL 4 MG/2ML IJ SOLN: 4.0000 mg | Freq: Once | INTRAMUSCULAR | Status: DC | PRN

## 2014-07-18 MED ORDER — FENTANYL CITRATE 0.05 MG/ML IJ SOLN
25.0000 ug | INTRAMUSCULAR | Status: DC | PRN
  Administered 2014-07-18 (×2): 50 ug via INTRAVENOUS

## 2014-07-18 MED ORDER — MEPERIDINE HCL 25 MG/ML IJ SOLN: 6.2500 mg | INTRAMUSCULAR | Status: DC | PRN

## 2014-07-18 MED ORDER — DIPHENHYDRAMINE HCL 50 MG/ML IJ SOLN
12.5000 mg | INTRAMUSCULAR | Status: DC | PRN
  Administered 2014-07-18: 12.5 mg via INTRAVENOUS
  Filled 2014-07-18: qty 1

## 2014-07-18 MED ORDER — ZOLPIDEM TARTRATE 5 MG PO TABS: 5.0000 mg | ORAL_TABLET | Freq: Every evening | ORAL | Status: DC | PRN

## 2014-07-18 MED ORDER — WITCH HAZEL-GLYCERIN EX PADS: 1.0000 "application " | MEDICATED_PAD | CUTANEOUS | Status: DC | PRN

## 2014-07-18 MED ORDER — LACTATED RINGERS IV SOLN
INTRAVENOUS | Status: DC | PRN
  Administered 2014-07-18 (×2): via INTRAVENOUS

## 2014-07-18 MED ORDER — SODIUM BICARBONATE 8.4 % IV SOLN
INTRAVENOUS | Status: DC | PRN
  Administered 2014-07-18 (×2): 5 mL via EPIDURAL

## 2014-07-18 MED ORDER — 0.9 % SODIUM CHLORIDE (POUR BTL) OPTIME
TOPICAL | Status: DC | PRN
  Administered 2014-07-18: 1000 mL

## 2014-07-18 MED ORDER — PRENATAL MULTIVITAMIN CH
1.0000 | ORAL_TABLET | Freq: Every day | ORAL | Status: DC
  Administered 2014-07-19 – 2014-07-20 (×2): 1 via ORAL
  Filled 2014-07-18 (×2): qty 1

## 2014-07-18 MED ORDER — MORPHINE SULFATE (PF) 0.5 MG/ML IJ SOLN
INTRAMUSCULAR | Status: DC | PRN
  Administered 2014-07-18: 2 mg via INTRAVENOUS
  Administered 2014-07-18: 3 mg via EPIDURAL

## 2014-07-18 MED ORDER — PANTOPRAZOLE SODIUM 40 MG IV SOLR
40.0000 mg | Freq: Once | INTRAVENOUS | Status: AC
  Administered 2014-07-18: 40 mg via INTRAVENOUS
  Filled 2014-07-18: qty 40

## 2014-07-18 MED ORDER — OXYTOCIN 10 UNIT/ML IJ SOLN
INTRAMUSCULAR | Status: AC
  Filled 2014-07-18: qty 4

## 2014-07-18 MED ORDER — SCOPOLAMINE 1 MG/3DAYS TD PT72
1.0000 | MEDICATED_PATCH | Freq: Once | TRANSDERMAL | Status: DC
  Administered 2014-07-18: 1.5 mg via TRANSDERMAL

## 2014-07-18 MED ORDER — SIMETHICONE 80 MG PO CHEW: 80.0000 mg | CHEWABLE_TABLET | ORAL | Status: DC | PRN

## 2014-07-18 MED ORDER — SODIUM CHLORIDE 0.9 % IJ SOLN: 3.0000 mL | INTRAMUSCULAR | Status: DC | PRN

## 2014-07-18 MED ORDER — MENTHOL 3 MG MT LOZG: 1.0000 | LOZENGE | OROMUCOSAL | Status: DC | PRN

## 2014-07-18 MED ORDER — NALBUPHINE HCL 10 MG/ML IJ SOLN: 5.0000 mg | INTRAMUSCULAR | Status: DC | PRN

## 2014-07-18 MED ORDER — CEFAZOLIN SODIUM-DEXTROSE 2-3 GM-% IV SOLR
INTRAVENOUS | Status: AC
  Filled 2014-07-18: qty 50

## 2014-07-18 MED ORDER — LACTATED RINGERS IV SOLN
40.0000 [IU] | INTRAVENOUS | Status: DC | PRN
  Administered 2014-07-18: 40 [IU] via INTRAVENOUS

## 2014-07-18 MED ORDER — FERROUS SULFATE 325 (65 FE) MG PO TABS
325.0000 mg | ORAL_TABLET | Freq: Two times a day (BID) | ORAL | Status: DC
  Administered 2014-07-18 – 2014-07-19 (×2): 325 mg via ORAL
  Filled 2014-07-18 (×2): qty 1

## 2014-07-18 MED ORDER — OXYCODONE-ACETAMINOPHEN 5-325 MG PO TABS
1.0000 | ORAL_TABLET | ORAL | Status: DC | PRN
  Administered 2014-07-19 – 2014-07-20 (×2): 1 via ORAL
  Filled 2014-07-18 (×2): qty 1

## 2014-07-18 MED ORDER — NALBUPHINE HCL 10 MG/ML IJ SOLN: 5.0000 mg | Freq: Once | INTRAMUSCULAR | Status: AC | PRN

## 2014-07-18 MED ORDER — OXYCODONE-ACETAMINOPHEN 5-325 MG PO TABS: 2.0000 | ORAL_TABLET | ORAL | Status: DC | PRN

## 2014-07-18 MED ORDER — DIPHENHYDRAMINE HCL 25 MG PO CAPS
25.0000 mg | ORAL_CAPSULE | ORAL | Status: DC | PRN
  Administered 2014-07-19 (×2): 25 mg via ORAL
  Filled 2014-07-18 (×2): qty 1

## 2014-07-18 MED ORDER — NIFEDIPINE ER OSMOTIC RELEASE 30 MG PO TB24
30.0000 mg | ORAL_TABLET | Freq: Every day | ORAL | Status: DC
  Filled 2014-07-18 (×2): qty 1

## 2014-07-18 MED ORDER — KETOROLAC TROMETHAMINE 30 MG/ML IJ SOLN
30.0000 mg | Freq: Four times a day (QID) | INTRAMUSCULAR | Status: AC | PRN
  Administered 2014-07-18: 30 mg via INTRAVENOUS

## 2014-07-18 MED ORDER — MORPHINE SULFATE 0.5 MG/ML IJ SOLN
INTRAMUSCULAR | Status: AC
  Filled 2014-07-18: qty 10

## 2014-07-18 MED ORDER — ONDANSETRON HCL 4 MG/2ML IJ SOLN
4.0000 mg | Freq: Three times a day (TID) | INTRAMUSCULAR | Status: DC | PRN
Start: 2014-07-18 — End: 2014-07-18

## 2014-07-18 MED ORDER — DIPHENHYDRAMINE HCL 25 MG PO CAPS: 25.0000 mg | ORAL_CAPSULE | Freq: Four times a day (QID) | ORAL | Status: DC | PRN

## 2014-07-18 MED ORDER — NALOXONE HCL 1 MG/ML IJ SOLN
1.0000 ug/kg/h | INTRAVENOUS | Status: DC | PRN
  Filled 2014-07-18: qty 2

## 2014-07-18 MED ORDER — LANOLIN HYDROUS EX OINT: 1.0000 "application " | TOPICAL_OINTMENT | CUTANEOUS | Status: DC | PRN

## 2014-07-18 MED ORDER — NALBUPHINE HCL 10 MG/ML IJ SOLN
5.0000 mg | Freq: Once | INTRAMUSCULAR | Status: AC | PRN
  Administered 2014-07-18: 5 mg via SUBCUTANEOUS

## 2014-07-18 MED ORDER — ACETAMINOPHEN 500 MG PO TABS
1000.0000 mg | ORAL_TABLET | Freq: Four times a day (QID) | ORAL | Status: AC
  Administered 2014-07-18 (×2): 1000 mg via ORAL
  Filled 2014-07-18 (×2): qty 2

## 2014-07-18 MED ORDER — KETOROLAC TROMETHAMINE 30 MG/ML IJ SOLN: 30.0000 mg | Freq: Four times a day (QID) | INTRAMUSCULAR | Status: AC | PRN

## 2014-07-18 MED ORDER — FENTANYL CITRATE 0.05 MG/ML IJ SOLN
INTRAMUSCULAR | Status: DC | PRN
  Administered 2014-07-18 (×2): 50 ug via INTRAVENOUS

## 2014-07-18 MED ORDER — SIMETHICONE 80 MG PO CHEW
80.0000 mg | CHEWABLE_TABLET | Freq: Three times a day (TID) | ORAL | Status: DC
  Administered 2014-07-18 – 2014-07-20 (×5): 80 mg via ORAL
  Filled 2014-07-18 (×6): qty 1

## 2014-07-18 MED ORDER — MISOPROSTOL 200 MCG PO TABS: 800.0000 ug | ORAL_TABLET | Freq: Once | ORAL | Status: AC | PRN

## 2014-07-18 MED ORDER — ONDANSETRON HCL 4 MG/2ML IJ SOLN
4.0000 mg | Freq: Three times a day (TID) | INTRAMUSCULAR | Status: DC | PRN
Start: 2014-07-18 — End: 2014-07-20

## 2014-07-18 MED ORDER — DIPHENHYDRAMINE HCL 25 MG PO CAPS: 25.0000 mg | ORAL_CAPSULE | ORAL | Status: DC | PRN

## 2014-07-18 MED ORDER — FAMOTIDINE IN NACL 20-0.9 MG/50ML-% IV SOLN
20.0000 mg | Freq: Once | INTRAVENOUS | Status: AC
  Administered 2014-07-18: 20 mg via INTRAVENOUS
  Filled 2014-07-18: qty 50

## 2014-07-18 MED ORDER — NALBUPHINE HCL 10 MG/ML IJ SOLN
5.0000 mg | INTRAMUSCULAR | Status: DC | PRN
  Administered 2014-07-18 – 2014-07-19 (×3): 5 mg via SUBCUTANEOUS
  Filled 2014-07-18 (×3): qty 1

## 2014-07-18 MED ORDER — HYDROMORPHONE HCL 1 MG/ML IJ SOLN: 0.2500 mg | INTRAMUSCULAR | Status: DC | PRN

## 2014-07-18 MED ORDER — ONDANSETRON HCL 4 MG/2ML IJ SOLN
INTRAMUSCULAR | Status: DC | PRN
  Administered 2014-07-18: 4 mg via INTRAVENOUS

## 2014-07-18 MED ORDER — SENNOSIDES-DOCUSATE SODIUM 8.6-50 MG PO TABS
2.0000 | ORAL_TABLET | ORAL | Status: DC
  Administered 2014-07-18 – 2014-07-20 (×2): 2 via ORAL
  Filled 2014-07-18 (×2): qty 2

## 2014-07-18 MED ORDER — FENTANYL CITRATE 0.05 MG/ML IJ SOLN
INTRAMUSCULAR | Status: AC
  Filled 2014-07-18: qty 2

## 2014-07-18 MED ORDER — DIPHENHYDRAMINE HCL 50 MG/ML IJ SOLN: 12.5000 mg | INTRAMUSCULAR | Status: DC | PRN

## 2014-07-18 MED ORDER — NALBUPHINE HCL 10 MG/ML IJ SOLN
5.0000 mg | Freq: Once | INTRAMUSCULAR | Status: AC | PRN
Start: 2014-07-18 — End: 2014-07-18

## 2014-07-18 MED ORDER — SODIUM BICARBONATE 8.4 % IV SOLN
INTRAVENOUS | Status: AC
  Filled 2014-07-18: qty 50

## 2014-07-18 MED ORDER — LIDOCAINE-EPINEPHRINE (PF) 2 %-1:200000 IJ SOLN
INTRAMUSCULAR | Status: AC
  Filled 2014-07-18: qty 20

## 2014-07-18 MED ORDER — IBUPROFEN 600 MG PO TABS
600.0000 mg | ORAL_TABLET | Freq: Four times a day (QID) | ORAL | Status: DC
  Administered 2014-07-18 – 2014-07-20 (×7): 600 mg via ORAL
  Filled 2014-07-18 (×7): qty 1

## 2014-07-18 MED ORDER — TETANUS-DIPHTH-ACELL PERTUSSIS 5-2.5-18.5 LF-MCG/0.5 IM SUSP: 0.5000 mL | Freq: Once | INTRAMUSCULAR | Status: DC

## 2014-07-18 MED ORDER — OXYTOCIN 40 UNITS IN LACTATED RINGERS INFUSION - SIMPLE MED: 62.5000 mL/h | INTRAVENOUS | Status: AC

## 2014-07-18 SURGICAL SUPPLY — 39 items
BARRIER ADHS 3X4 INTERCEED (GAUZE/BANDAGES/DRESSINGS) ×3 IMPLANT
BENZOIN TINCTURE PRP APPL 2/3 (GAUZE/BANDAGES/DRESSINGS) ×3 IMPLANT
CLAMP CORD UMBIL (MISCELLANEOUS) IMPLANT
CLOSURE WOUND 1/2 X4 (GAUZE/BANDAGES/DRESSINGS) ×1
CLOTH BEACON ORANGE TIMEOUT ST (SAFETY) ×3 IMPLANT
DRAPE SHEET LG 3/4 BI-LAMINATE (DRAPES) IMPLANT
DRSG OPSITE POSTOP 4X10 (GAUZE/BANDAGES/DRESSINGS) ×3 IMPLANT
DRSG TELFA 3X8 NADH (GAUZE/BANDAGES/DRESSINGS) ×3 IMPLANT
DURAPREP 26ML APPLICATOR (WOUND CARE) ×3 IMPLANT
ELECT REM PT RETURN 9FT ADLT (ELECTROSURGICAL) ×3
ELECTRODE REM PT RTRN 9FT ADLT (ELECTROSURGICAL) ×1 IMPLANT
EXTRACTOR VACUUM BELL STYLE (SUCTIONS) IMPLANT
GLOVE BIO SURGEON STRL SZ7 (GLOVE) ×3 IMPLANT
GLOVE BIOGEL PI IND STRL 7.0 (GLOVE) ×1 IMPLANT
GLOVE BIOGEL PI INDICATOR 7.0 (GLOVE) ×2
GOWN STRL REUS W/TWL LRG LVL3 (GOWN DISPOSABLE) ×6 IMPLANT
KIT ABG SYR 3ML LUER SLIP (SYRINGE) IMPLANT
LIQUID BAND (GAUZE/BANDAGES/DRESSINGS) IMPLANT
NEEDLE HYPO 25X5/8 SAFETYGLIDE (NEEDLE) IMPLANT
NS IRRIG 1000ML POUR BTL (IV SOLUTION) ×3 IMPLANT
PACK C SECTION WH (CUSTOM PROCEDURE TRAY) ×3 IMPLANT
PAD ABD 8X7 1/2 STERILE (GAUZE/BANDAGES/DRESSINGS) ×3 IMPLANT
PAD OB MATERNITY 4.3X12.25 (PERSONAL CARE ITEMS) ×3 IMPLANT
RTRCTR C-SECT PINK 25CM LRG (MISCELLANEOUS) ×3 IMPLANT
SPONGE GAUZE 4X4 12PLY STER LF (GAUZE/BANDAGES/DRESSINGS) ×6 IMPLANT
STRIP CLOSURE SKIN 1/2X4 (GAUZE/BANDAGES/DRESSINGS) ×2 IMPLANT
SUT CHROMIC 0 CTX 36 (SUTURE) IMPLANT
SUT PLAIN 2 0 (SUTURE)
SUT PLAIN 2 0 XLH (SUTURE) ×3 IMPLANT
SUT PLAIN ABS 2-0 54XMFL TIE (SUTURE) IMPLANT
SUT VIC AB 0 CT1 27 (SUTURE) ×4
SUT VIC AB 0 CT1 27XBRD ANBCTR (SUTURE) ×2 IMPLANT
SUT VIC AB 0 CTX 36 (SUTURE) ×6
SUT VIC AB 0 CTX36XBRD ANBCTRL (SUTURE) ×3 IMPLANT
SUT VIC AB 2-0 CT1 27 (SUTURE) ×2
SUT VIC AB 2-0 CT1 TAPERPNT 27 (SUTURE) ×1 IMPLANT
SUT VIC AB 4-0 KS 27 (SUTURE) ×3 IMPLANT
TOWEL OR 17X24 6PK STRL BLUE (TOWEL DISPOSABLE) ×3 IMPLANT
TRAY FOLEY CATH 14FR (SET/KITS/TRAYS/PACK) IMPLANT

## 2014-07-18 NOTE — Progress Notes (Signed)
S: Has not been able to sleep. Still comfortable w/ epidural. Denies s/s of preeclampsia. Family at bedside. O: BP range 128-166/71-101. Has had two systolic BPs of 165 and 166 respectively during night. Lungs CTAB. CV: RRR. Ext: 2+ brachial DTRs, no clonus. BL FHR 130 w/ minimal variability, no accels, no decels. Several periods of moderate variability w/ accels. Ctxs q 2-4 min, MVUs 150. Foley placed at 11 pm - UOP just around 30 cc/hr, clear yellow. Pitocin increased to 8 mius/min. A: BP elevation despite epidural. MVUs now inadequate. Low normal UOP. Overall reative tracing. P: Repeat preeclampsia labs to include urine PCR. If next BP meets criteria for Hydralazine, will give. Strict I&O. Continue w/ Pitocin.  Sherre ScarletKimberly Dezmen Alcock, CNM 07/18/2014, 1:15 AM

## 2014-07-18 NOTE — Progress Notes (Signed)
In to assess pt. No cervical change per CNM.  9 cc urine at bedside. Cervix 4/100, baby behind symphisis pubis EM:  +accels  Contractions resolving Failure to progress , proceed with primary LTCS.  R/B/A of cesarean section discussed.  Consent signed.

## 2014-07-18 NOTE — Progress Notes (Addendum)
Dr. Dion BodyVarnado given update. Cvx unchanged. Reassuring FHRT. Ctxs q 4-5 min, inadequate MVUs. Pitocin at 13 mius/min. Informed couple of c-section and that Dr. Dion BodyVarnado en route to discuss further. Pitocin off per Dr. Dion BodyVarnado.    Sherre ScarletKimberly Monic Engelmann, CNM 07/18/2014, 06:40 AM

## 2014-07-18 NOTE — Anesthesia Postprocedure Evaluation (Signed)
Anesthesia Post Note  Patient: Shirley Jennings  Procedure(s) Performed: * No procedures listed *  Anesthesia type: Epidural  Patient location: Mother/Baby  Post pain: Pain level controlled  Post assessment: Post-op Vital signs reviewed  Last Vitals:  Filed Vitals:   07/18/14 1530  BP: 128/77  Pulse: 68  Temp: 36.8 C  Resp: 18    Post vital signs: Reviewed  Level of consciousness:alert  Complications: No apparent anesthesia complications

## 2014-07-18 NOTE — Transfer of Care (Signed)
Immediate Anesthesia Transfer of Care Note  Patient: Shirley Jennings  Procedure(s) Performed: Procedure(s): CESAREAN SECTION (N/A)  Patient Location: PACU  Anesthesia Type:Epidural  Level of Consciousness: awake, alert  and oriented  Airway & Oxygen Therapy: Patient Spontanous Breathing  Post-op Assessment: Report given to RN  Post vital signs: Reviewed  Last Vitals:  Filed Vitals:   07/18/14 0825  BP: 163/80  Pulse: 98  Temp:   Resp:     Complications: No apparent anesthesia complications

## 2014-07-18 NOTE — Progress Notes (Addendum)
Subjective: Sleeping, yet easily aroused.   Objective: BP 137/89 mmHg  Pulse 71  Temp(Src) 97.9 F (36.6 C) (Axillary)  Resp 18  Ht 5\' 3"  (1.6 m)  Wt 180 lb (81.647 kg)  BMI 31.89 kg/m2  SpO2 96%  LMP 08/05/2013 I/O last 3 completed shifts: In: 330.4 [I.V.:330.4] Out: 1800 [Urine:1800] Total I/O In: 636 [I.V.:636] Out: 60 [Urine:60]  Today's Vitals   07/18/14 0040 07/18/14 0100 07/18/14 0130 07/18/14 0200  BP: 160/91 158/96 145/92 137/89  Pulse: 66 68 67 71  Temp:   97.9 F (36.6 C)   TempSrc:   Axillary   Resp: 18  18   Height:      Weight:      SpO2:      PainSc:       Results for orders placed or performed during the hospital encounter of 07/15/14 (from the past 24 hour(s))  CBC     Status: Abnormal   Collection Time: 07/17/14  4:06 PM  Result Value Ref Range   WBC 10.9 (H) 4.0 - 10.5 K/uL   RBC 3.57 (L) 3.87 - 5.11 MIL/uL   Hemoglobin 10.9 (L) 12.0 - 15.0 g/dL   HCT 16.131.9 (L) 09.636.0 - 04.546.0 %   MCV 89.4 78.0 - 100.0 fL   MCH 30.5 26.0 - 34.0 pg   MCHC 34.2 30.0 - 36.0 g/dL   RDW 40.914.0 81.111.5 - 91.415.5 %   Platelets 175 150 - 400 K/uL  CBC     Status: Abnormal   Collection Time: 07/18/14  1:00 AM  Result Value Ref Range   WBC 14.8 (H) 4.0 - 10.5 K/uL   RBC 3.78 (L) 3.87 - 5.11 MIL/uL   Hemoglobin 11.5 (L) 12.0 - 15.0 g/dL   HCT 78.233.7 (L) 95.636.0 - 21.346.0 %   MCV 89.2 78.0 - 100.0 fL   MCH 30.4 26.0 - 34.0 pg   MCHC 34.1 30.0 - 36.0 g/dL   RDW 08.614.1 57.811.5 - 46.915.5 %   Platelets 178 150 - 400 K/uL  Comprehensive metabolic panel     Status: Abnormal   Collection Time: 07/18/14  1:00 AM  Result Value Ref Range   Sodium 138 135 - 145 mmol/L   Potassium 3.0 (L) 3.5 - 5.1 mmol/L   Chloride 106 96 - 112 mmol/L   CO2 21 19 - 32 mmol/L   Glucose, Bld 116 (H) 70 - 99 mg/dL   BUN 5 (L) 6 - 23 mg/dL   Creatinine, Ser 6.290.87 0.50 - 1.10 mg/dL   Calcium 8.7 8.4 - 52.810.5 mg/dL   Total Protein 6.5 6.0 - 8.3 g/dL   Albumin 2.8 (L) 3.5 - 5.2 g/dL   AST 35 0 - 37 U/L   ALT 26 0 - 35  U/L   Alkaline Phosphatase 197 (H) 39 - 117 U/L   Total Bilirubin 0.5 0.3 - 1.2 mg/dL   GFR calc non Af Amer 89 (L) >90 mL/min   GFR calc Af Amer >90 >90 mL/min   Anion gap 11 5 - 15  Lactate dehydrogenase     Status: None   Collection Time: 07/18/14  1:00 AM  Result Value Ref Range   LDH 200 94 - 250 U/L  Protein / creatinine ratio, urine     Status: Abnormal   Collection Time: 07/18/14  1:00 AM  Result Value Ref Range   Creatinine, Urine 46.00 mg/dL   Total Protein, Urine 11 mg/dL   Protein Creatinine Ratio 0.24 (H) 0.00 - 0.15  Uric acid     Status: None   Collection Time: 07/18/14  1:00 AM  Result Value Ref Range   Uric Acid, Serum 5.4 2.4 - 7.0 mg/dL   FHT: BL 161 w/ moderate variability, +accels. Late decel @ 0145: To nadir 85 bpm w/ slow return to baseline (x 1.5 min) Late decel @ 0149: To nadir 100 bpm w/ gradual return to baseline (less than 1 min) UC:   irregular, every 2-3 minutes; MVUs 115 SVE:   Dilation: 1.5 Effacement (%): 100 Station: -1 Exam by:: Larose Kells RN Pitocin at 8 mius/min Foley cath checked to ensure proper placement  Assessment:  Normal labs except for hypokalemia BPs stable Low UOP (serum cr 0.87) Late decels improved w/ position change; tracing overall reasurring No cervical change Inadequate MVUs  Plan: 500 ml bolus KDur 20 mEq CTO closely Consult as indicated  Sherre Scarlet CNM 07/18/2014, 2:11 AM

## 2014-07-18 NOTE — Brief Op Note (Signed)
07/15/2014 - 07/18/2014  10:04 AM  PATIENT:  Shirley Jennings  [age] y.o. female  PRE-OPERATIVE DIAGNOSIS: IUP at 6839 0/7 weeks,  Failure to progress, Gest HTN   POST-OPERATIVE DIAGNOSIS:  Same, CPD (Occiput Transverse)  PROCEDURE:  Procedure(s): CESAREAN SECTION (N/A), Primary, LTCS, 2 layer closure  SURGEON:  Surgeon(s) and Role:    * Geryl RankinsEvelyn Jhoselyn Ruffini, MD - Primary  PHYSICIAN ASSISTANT: None  ASSISTANTS: Technician   ANESTHESIA:   epidural  EBL:  Total I/O In: 1600 [I.V.:1600] Out: 900 [Urine:200; Blood:700]  BLOOD ADMINISTERED:none  DRAINS: Urinary Catheter (Foley)   LOCAL MEDICATIONS USED:  NONE  SPECIMEN:  No specimen  DISPOSITION OF SPECIMEN:  N/A  COUNTS:  YES  TOURNIQUET:  * No tourniquets in log *  DICTATION: .Other Dictation: Dictation Number [id number]  PLAN OF CARE: Transfer ICU vs. Postpartum  PATIENT DISPOSITION:  PACU - hemodynamically stable.   Delay start of Pharmacological VTE agent (>24hrs) due to surgical blood loss or risk of bleeding: yes

## 2014-07-18 NOTE — Anesthesia Postprocedure Evaluation (Signed)
  Anesthesia Post-op Note  Patient: Shirley Jennings  Procedure(s) Performed: Procedure(s) (LRB): CESAREAN SECTION (N/A)  Patient Location: PACU  Anesthesia Type: epidural  Level of Consciousness: awake and alert   Airway and Oxygen Therapy: Patient Spontanous Breathing  Post-op Pain: mild  Post-op Assessment: Post-op Vital signs reviewed, Patient's Cardiovascular Status Stable, Respiratory Function Stable, Patent Airway and No signs of Nausea or vomiting  Last Vitals:  Filed Vitals:   07/18/14 1100  BP: 150/87  Pulse: 85  Temp:   Resp: 18    Post-op Vital Signs: stable   Complications: No apparent anesthesia complications

## 2014-07-18 NOTE — Progress Notes (Addendum)
Was asked to evaluate pt due to no uop. Pt w/ c/o heartburn. Remains comfortable w/ epidural. Denies headache, visual disturbances, RUQ pain, SOB, CP, weakness or N/V.  Pt tearful and FOB frustrated w/ process as pt has not been able to rest due to staff having to come into the room constantly for tasks such as blood work and replacement of foley catheter, just to name a few. Now spouse is requesting c-section. Both concerned that she will have a c-section anyway and does not understand the delay in making the call. Both feel she is not progressing and having urinary issues.   Today's Vitals   07/18/14 0100 07/18/14 0130 07/18/14 0200 07/18/14 0230  BP: 158/96 145/92 137/89 156/88  Pulse: 68 67 71 78  Temp:  97.9 F (36.6 C)    TempSrc:  Axillary    Resp:  18  18  Height:      Weight:      SpO2:      PainSc:       Foley cath replaced. Clear urine noted, in tube. Bladder full by palpation. Bladder scan showed some residual. Cvx 3+/100/0. Suspect that uop may be low due to position of head. Attempted to gently maneuver baby's head to allow flow w/ very little success. Since serum cr WNL, no concerns for poor kidney function. BPs stable. Copious amount of clear amniotic fluid noted during exam. Cat 1 tracing, ctxs q 2-3 min. +Peripheral edema, R>L.  I reiterated that a c-section is not warranted at present. I pointed out positive points, i.e., copious amount of clear amniotic fluid (none seen earlier), head low in pelvis and cervical change. Couple still not reassured by this information. I explained the steps of this process, i.e., that her cvx had to initially be ripened. Explained to both that inductions can take a while and without a clear reason for a c-section, the plan is to continue with increasing Pitocin. I also informed them that a c-section is always a last resort and the recovery is much more involved. Explained that if she has to have a c-section, it will not be because we, the team,  did not exhaust all measures for her to deliver vaginally.     P: Monitor closely. Allow pt to rest as much as possible. Her primary nurse will only go in room to increase Pitocin. Consult prn. Although progressing, will continue to increase Pitocin to achieve a minimum of 180 MVUs. Protonix IV x 1 now.  Sherre ScarletKimberly Medea Deines, CNM 07/18/14, 04:00 AM

## 2014-07-18 NOTE — Lactation Note (Signed)
This note was copied from the chart of Shirley Jennings. Lactation Consultation Note  Patient Name: Shirley Jennings ZOXWR'UToday's Date: 07/18/2014 Reason for consult: Initial assessment of this mom and baby at 13 hours pp. This is mom's first baby and thus far, baby has been latching well, per mom and nursing assessment.  Baby placed in football position on (L) side and after a brief attempt, baby latches well and lips flanged, sucking bursts rhythmical and swallows noted.  Mom able to latch without assistance and LC observed >5 minutes of feeding until RN returned.  LC encouraged frequent STS and cue feedings.  Mom has been shown hand expression by RN, Shirley Jennings and drops expressible.  Mom encouraged to feed baby 8-12 times/24 hours and with feeding cues. LC encouraged review of Baby and Me pp 9, 14 and 20-25 for STS and BF information.Shirley Jennings. LC provided Shirley Jennings Resource brochure and [facility] and list of community and web site resources. Initial LATCH score=7 per RN assessment.    Maternal Data Formula Feeding for Exclusion: No Has patient been taught Hand Expression?: Yes (LC observed RN assisting with hand expression) Does the patient have breastfeeding experience prior to this delivery?: No  Feeding Feeding Type: Breast Fed Length of feed: 20 min  LATCH Score/Interventions             Initial LATCH score=7 per RN assessment.             Lactation Tools Discussed/Used   STS, cue feeding, hand expression Signs of proper latch and milk transfer  Consult Status Consult Status: Follow-up Date: 07/19/14 Follow-up type: In-patient    Shirley Jennings, Shirley Jennings [facility] 07/18/2014, 10:25 PM

## 2014-07-19 ENCOUNTER — Encounter (HOSPITAL_COMMUNITY): Payer: Self-pay | Admitting: Obstetrics and Gynecology

## 2014-07-19 DIAGNOSIS — Z98891 History of uterine scar from previous surgery: Secondary | ICD-10-CM

## 2014-07-19 LAB — CBC
HEMATOCRIT: 23.1 % — AB (ref 36.0–46.0)
Hemoglobin: 7.9 g/dL — ABNORMAL LOW (ref 12.0–15.0)
MCH: 30.6 pg (ref 26.0–34.0)
MCHC: 34.2 g/dL (ref 30.0–36.0)
MCV: 89.5 fL (ref 78.0–100.0)
Platelets: 156 10*3/uL (ref 150–400)
RBC: 2.58 MIL/uL — ABNORMAL LOW (ref 3.87–5.11)
RDW: 14.1 % (ref 11.5–15.5)
WBC: 16.9 10*3/uL — AB (ref 4.0–10.5)

## 2014-07-19 LAB — BIRTH TISSUE RECOVERY COLLECTION (PLACENTA DONATION)

## 2014-07-19 MED ORDER — FERROUS SULFATE 325 (65 FE) MG PO TABS
325.0000 mg | ORAL_TABLET | Freq: Two times a day (BID) | ORAL | Status: DC
  Administered 2014-07-19 – 2014-07-20 (×3): 325 mg via ORAL
  Filled 2014-07-19 (×2): qty 1

## 2014-07-19 NOTE — Progress Notes (Signed)
Postoperative Note Day # 1  S:  Patient resting comfortable in bed.  Pain controlled.  Tolerating general. No flatus, no BM.  Lochia appropriate.  Minimal ambulation.  Foley removed this am.  She denies n/v/f/c, SOB, or CP.  Pt plans on breastfeeding.  O: Temp:  [98 F (36.7 C)-98.7 F (37.1 C)] 98.2 F (36.8 C) (03/04 0606) Pulse Rate:  [68-112] 68 (03/04 0606) Resp:  [17-22] 20 (03/04 0606) BP: (110-194)/(60-102) 132/71 mmHg (03/04 0606) SpO2:  [97 %-100 %] 100 % (03/04 0606) Gen: A&Ox3, NAD CV: RRR, no MRG Resp: CTAB Abdomen: mild  distension noted, but soft and non-tender, +BS Uterus: firm, non-tender, below umbilicus Incision: c/d/i, bandage on Ext: Edema dramatically improved, 1+ bilaterally edema R>L, no calf tenderness bilaterally  Labs:  Recent Labs  07/18/14 1017 07/19/14 0545  HGB 10.4* 7.9*   Results for Raelyn NumberBOYD, Willena T (MRN [id number]) as of 07/19/2014 08:34  Ref. Range 07/18/2014 19:28  Sodium Latest Range: 135-145 mmol/L 139  Potassium Latest Range: 3.5-5.1 mmol/L 3.2 (L)  Chloride Latest Range: 96-112 mmol/L 109  CO2 Latest Range: 19-32 mmol/L 24  BUN Latest Range: 6-23 mg/dL 6  Creatinine Latest Range: 0.50-1.10 mg/dL 9.141.05  Calcium Latest Range: 8.4-10.5 mg/dL 7.9 (L)  GFR calc non Af Amer Latest Range: >90 mL/min 71 (L)  GFR calc Af Amer Latest Range: >90 mL/min 82 (L)  Glucose Latest Range: 70-99 mg/dL 782140 (H)  Anion gap Latest Range: 5-15  6    A/P: Pt is a 29 y.o. G1P1001 s/p primary C-section, POD #1 -Gestational HTN: BP elevated overnight, but within normal limits this am.  No BP medication at this time, will continue to closely monitor. - Pain well controlled -GU: Foley removed this am, UOP adequate, pt to void this am -GI: Tolerating general diet -Activity: encouraged sitting up to chair and ambulation as tolerated -Prophylaxis: early ambulation, SCDs while in bed -Labs: Hgb appropriate decline, will start on iron twice daily.  Cr function  returning to baseline suspect elevation due to obstruction during delivery.  No further work up needed as pt is now voiding without problems.  Dispo: Continue with routine postoperative care.  Myna HidalgoJennifer Mliss Wedin, DO [phone] (pager) [phone] (office)

## 2014-07-20 DIAGNOSIS — Z8669 Personal history of other diseases of the nervous system and sense organs: Secondary | ICD-10-CM

## 2014-07-20 DIAGNOSIS — K117 Disturbances of salivary secretion: Secondary | ICD-10-CM | POA: Diagnosis not present

## 2014-07-20 MED ORDER — IBUPROFEN 600 MG PO TABS: 600.0000 mg | ORAL_TABLET | Freq: Four times a day (QID) | ORAL | Status: AC | PRN

## 2014-07-20 MED ORDER — OXYCODONE-ACETAMINOPHEN 5-325 MG PO TABS: 1.0000 | ORAL_TABLET | ORAL | Status: AC | PRN

## 2014-07-20 MED ORDER — FERROUS SULFATE 325 (65 FE) MG PO TABS: 325.0000 mg | ORAL_TABLET | Freq: Two times a day (BID) | ORAL | Status: AC

## 2014-07-20 NOTE — Lactation Note (Signed)
This note was copied from the chart of Shirley Elmarie ShileySantana Baranski. Lactation Consultation Note; Mother assist with positioning infant on with a deeper latch. FOB taught how to assist mother and check infants lower jaw for wider gape. Observed consistent pattern of suckling and swallows. Observed slight pressure on underside of the nipple. Advised mother to hold infant close with chin tucked in and rotate positions. Mother has good flow of colostrum. She has a hand pump and plans to get a pump from her insurance company. Advised mother to continue to feed infant 8-12 times in 24 hours. Advised in not using a pacifier and informed of the risk to decreased milk supply. Reviewed treatment prevention of engorgement in baby and me book. Mother was scheduled for a follow up with LC at her request. Wednesday March 9 at 9 am. Mother is aware of all LC services.   Patient Name: Shirley Jennings Shirley Jennings: 07/20/2014 Reason for consult: Follow-up assessment   Maternal Data    Feeding Feeding Type: Breast Fed Length of feed: 35 min  LATCH Score/Interventions Latch: Grasps breast easily, tongue down, lips flanged, rhythmical sucking. Intervention(s): Adjust position;Assist with latch;Breast compression  Audible Swallowing: Spontaneous and intermittent Intervention(s): Hand expression  Type of Nipple: Everted at rest and after stimulation  Comfort (Breast/Nipple): Soft / non-tender     Hold (Positioning): Assistance needed to correctly position infant at breast and maintain latch. Intervention(s): Support Pillows;Skin to skin  LATCH Score: 9  Lactation Tools Discussed/Used     Consult Status Consult Status: Follow-up Jennings: 07/24/14 Follow-up type: In-patient    Stevan BornKendrick, Sarena Jezek [facility] 07/20/2014, 10:17 AM

## 2014-07-20 NOTE — Discharge Instructions (Signed)
Postpartum Care After Cesarean Delivery °After you deliver your newborn (postpartum period), the usual stay in the hospital is 24-72 hours. If there were problems with your labor or delivery, or if you have other medical problems, you might be in the hospital longer.  °While you are in the hospital, you will receive help and instructions on how to care for yourself and your newborn during the postpartum period.  °While you are in the hospital: °· It is normal for you to have pain or discomfort from the incision in your abdomen. Be sure to tell your nurses when you are having pain, where the pain is located, and what makes the pain worse. °· If you are breastfeeding, you may feel uncomfortable contractions of your uterus for a couple of weeks. This is normal. The contractions help your uterus get back to normal size. °· It is normal to have some bleeding after delivery. °· For the first 1-3 days after delivery, the flow is red and the amount may be similar to a period. °· It is common for the flow to start and stop. °· In the first few days, you may pass some small clots. Let your nurses know if you begin to pass large clots or your flow increases. °· Do not  flush blood clots down the toilet before having the nurse look at them. °· During the next 3-10 days after delivery, your flow should become more watery and pink or brown-tinged in color. °· Ten to fourteen days after delivery, your flow should be a small amount of yellowish-white discharge. °· The amount of your flow will decrease over the first few weeks after delivery. Your flow may stop in 6-8 weeks. Most women have had their flow stop by 12 weeks after delivery. °· You should change your sanitary pads frequently. °· Wash your hands thoroughly with soap and water for at least 20 seconds after changing pads, using the toilet, or before holding or feeding your newborn. °· Your intravenous (IV) tubing will be removed when you are drinking enough fluids. °· The  urine drainage tube (urinary catheter) that was inserted before delivery may be removed within 6-8 hours after delivery or when feeling returns to your legs. You should feel like you need to empty your bladder within the first 6-8 hours after the catheter has been removed. °· In case you become weak, lightheaded, or faint, call your nurse before you get out of bed for the first time and before you take a shower for the first time. °· Within the first few days after delivery, your breasts may begin to feel tender and full. This is called engorgement. Breast tenderness usually goes away within 48-72 hours after engorgement occurs. You may also notice milk leaking from your breasts. If you are not breastfeeding, do not stimulate your breasts. Breast stimulation can make your breasts produce more milk. °· Spending as much time as possible with your newborn is very important. During this time, you and your newborn can feel close and get to know each other. Having your newborn stay in your room (rooming in) will help to strengthen the bond with your newborn. It will give you time to get to know your newborn and become comfortable caring for your newborn. °· Your hormones change after delivery. Sometimes the hormone changes can temporarily cause you to feel sad or tearful. These feelings should not last more than a few days. If these feelings last longer than that, you should talk to your   caregiver. °· If desired, talk to your caregiver about methods of family planning or contraception. °· Talk to your caregiver about immunizations. Your caregiver may want you to have the following immunizations before leaving the hospital: °· Tetanus, diphtheria, and pertussis (Tdap) or tetanus and diphtheria (Td) immunization. It is very important that you and your family (including grandparents) or others caring for your newborn are up-to-date with the Tdap or Td immunizations. The Tdap or Td immunization can help protect your newborn  from getting ill. °· Rubella immunization. °· Varicella (chickenpox) immunization. °· Influenza immunization. You should receive this annual immunization if you did not receive the immunization during your pregnancy. °Document Released: 01/26/2012 Document Reviewed: 01/26/2012 °ExitCare® Patient Information ©2015 [employer]. This information is not intended to replace advice given to you by your health care provider. Make sure you discuss any questions you have with your health care provider. ° °Iron Deficiency Anemia °Anemia is a condition in which there are less red blood cells or hemoglobin in the blood than normal. Hemoglobin is the part of red blood cells that carries oxygen. Iron deficiency anemia is anemia caused by too little iron. It is the most common type of anemia. It may leave you tired and short of breath. °CAUSES  °· Lack of iron in the diet. °· Poor absorption of iron, as seen with intestinal disorders. °· Intestinal bleeding. °· Heavy periods. °SIGNS AND SYMPTOMS  °Mild anemia may not be noticeable. Symptoms may include: °· Fatigue. °· Headache. °· Pale skin. °· Weakness. °· Tiredness. °· Shortness of breath. °· Dizziness. °· Cold hands and feet. °· Fast or irregular heartbeat. °DIAGNOSIS  °Diagnosis requires a thorough evaluation and physical exam by your health care provider. Blood tests are generally used to confirm iron deficiency anemia. Additional tests may be done to find the underlying cause of your anemia. These may include: °· Testing for blood in the stool (fecal occult blood test). °· A procedure to see inside the colon and rectum (colonoscopy). °· A procedure to see inside the esophagus and stomach (endoscopy). °TREATMENT  °Iron deficiency anemia is treated by correcting the cause of the deficiency. Treatment may involve: °· Adding iron-rich foods to your diet. °· Taking iron supplements. Pregnant or breastfeeding women need to take extra iron because their normal diet usually does  not provide the required amount. °· Taking vitamins. Vitamin C improves the absorption of iron. Your health care provider may recommend that you take your iron tablets with a glass of orange juice or vitamin C supplement. °· Medicines to make heavy menstrual flow lighter. °· Surgery. °HOME CARE INSTRUCTIONS  °· Take iron as directed by your health care provider. °¨ If you cannot tolerate taking iron supplements by mouth, talk to your health care provider about taking them through a vein (intravenously) or an injection into a muscle. °¨ For the best iron absorption, iron supplements should be taken on an empty stomach. If you cannot tolerate them on an empty stomach, you may need to take them with food. °¨ Do not drink milk or take antacids at the same time as your iron supplements. Milk and antacids may interfere with the absorption of iron. °¨ Iron supplements can cause constipation. Make sure to include fiber in your diet to prevent constipation. A stool softener may also be recommended. °· Take vitamins as directed by your health care provider. °· Eat a diet rich in iron. Foods high in iron include liver, lean beef, whole-grain bread, eggs, dried fruit,   and dark green leafy vegetables. °SEEK IMMEDIATE MEDICAL CARE IF:  °· You faint. If this happens, do not drive. Call your local emergency services (911 in [place] if no other help is available. °· You have chest pain. °· You feel nauseous or vomit. °· You have severe or increased shortness of breath with activity. °· You feel weak. °· You have a rapid heartbeat. °· You have unexplained sweating. °· You become light-headed when getting up from a chair or bed. °MAKE SURE YOU:  °· Understand these instructions. °· Will watch your condition. °· Will get help right away if you are not doing well or get worse. °Document Released: 04/30/2000 Document Revised: 05/08/2013 Document Reviewed: 01/08/2013 °ExitCare® Patient Information ©2015 [employer]. This information is  not intended to replace advice given to you by your health care provider. Make sure you discuss any questions you have with your health care provider. ° °

## 2014-07-20 NOTE — Discharge Summary (Signed)
Cesarean Section Delivery Discharge Summary  Shirley Jennings  DOB:    [birth year] MRN:    [id number] CSN:    [id number]  Date of admission:                  07/15/14  Date of discharge:                   07/20/14  Procedures this admission:  Induction of labor due to gestational hypertension, epidural anesthesia, primary LTCS due to FTP  Date of Delivery: 07/18/14  Newborn Data:  Live born female  Birth Weight: 7 lb 11.1 oz (3490 g) APGAR: 8, 8  Home with mother.   History of Present Illness:  Ms. Shirley NumberSantana T Dearing is a 29 y.o. female, G1P1001, who presents at 6142w0d weeks gestation. The patient has [facility] by Dr. Dion BodyVarnado.   Her pregnancy has been complicated by:  Patient Active Problem List   Diagnosis Date Noted  . Ptyalism 07/20/2014  . Hx of migraine headaches 07/20/2014  . Status post primary low transverse cesarean section 07/19/2014  . Gestational hypertension 07/15/2014  . Headache(784.0) 08/08/2013     Hospital Course--Unscheduled Cesarean:  Admitted 07/15/14 by Dr. Dion BodyVarnado for induction due to gestational hypertension. Had doppler flow for swelling in right leg.  Negative GBS. Had SROM after admission.  Utilized epidural for pain management.  Due to lack of progress beyond 4 cm, despite pitocin augmentation, and low urine output, she requested and was consented for cesarean, with Dr. Dion BodyVarnado performing a primary LTCS under epidural anesthesia, with delivery of a viable female, with weight and Apgars as listed below.  Urine output increased after delivery, with lack of urine output likely due to obstruction from fetal head. Urine creatinine was initially elevated to max of 1.38 on 07/18/14, then decreased to 1.05 later on the same day.  Infant was in good condition and remained at the patient's bedside.  The patient was taken to recovery in good condition.  Patient planned to breastfeed.  On post-op day 1, patient was doing well, tolerating a regular  diet, with Hgb of 7.9--started on FeSO4 BID.  Throughout her stay, her physical exam was WNL, her incision was CDI, and her vital signs remained stable.  By post-op day 2, she was up ad lib, tolerating a regular diet, with good pain control with po med.  She was deemed to have received the full benefit of her hospital stay, and was discharged home in stable condition.  Contraceptive choice was undecided at the time of delivery.  She was to call [facility] to schedule her pp visit.   She did not require any medication to treat her gestational hypertension during her hospitalization.  Feeding:  breast  Contraception:  Undecided.  Discharge hemoglobin:  HEMOGLOBIN  Date Value Ref Range Status  07/19/2014 7.9* 12.0 - 15.0 g/dL Final    Comment:    DELTA CHECK NOTED REPEATED TO VERIFY   07/18/2014 10.4* 12.0 - 15.0 g/dL Final  25/36/644003/07/2014 34.710.8* 12.0 - 15.0 g/dL Final   HCT  Date Value Ref Range Status  07/19/2014 23.1* 36.0 - 46.0 % Final  07/18/2014 30.7* 36.0 - 46.0 % Final  07/18/2014 31.6* 36.0 - 46.0 % Final   WBC  Date Value Ref Range Status  07/19/2014 16.9* 4.0 - 10.5 K/uL Final  07/18/2014 17.4* 4.0 - 10.5 K/uL Final  07/18/2014 15.5* 4.0 - 10.5 K/uL Final    Discharge Physical Exam:  General: alert Lochia: appropriate Uterine Fundus: firm Abdomen:  + bowel sounds, + flatus Incision: healing well DVT Evaluation: No evidence of DVT seen on physical exam. Negative Homan's sign.  Intrapartum Procedures: cesarean: low cervical, transverse Postpartum Procedures: none Complications-Operative and Postpartum: none  Discharge Diagnoses: Term Pregnancy-delivered and primary LTCS, gestational hypertension  Discharge Information:  Activity:           pelvic rest Diet:                routine Medications: Ibuprofen, Iron and Percocet Condition:      stable Instructions:  Discharge to: home  Follow-up Information    Follow up with [facility].    Specialty:  Obstetrics and Gynecology   Why:  Call Dr. Dawayne Patricia office to determine when next appointment needs to be.   Contact information:   [address] [place] [address] [phone]        Nigel Bridgeman CNM 07/20/2014 3:26 PM

## 2014-07-24 ENCOUNTER — Ambulatory Visit (HOSPITAL_COMMUNITY): Admit: 2014-07-24 | Payer: BC Managed Care – PPO

## 2014-07-24 NOTE — Op Note (Signed)
Shirley Jennings, Shirley Jennings                ACCOUNT NO.:  [id number]  MEDICAL RECORD NO.:  [id number]  LOCATION:  9137                          FACILITY:  [facility]  PHYSICIAN:  Pieter Partridge, MD   DATE OF BIRTH:  1986/03/27  DATE OF PROCEDURE:  07/18/2014 DATE OF DISCHARGE:  07/20/2014                              OPERATIVE REPORT   PREOPERATIVE DIAGNOSES: 1. Intrauterine pregnancy at 39-0/7. 2. Failure to progress. 3. Induction of labor due to gestational hypertension.  POSTOPERATIVE DIAGNOSES: 1. Intrauterine pregnancy at 39-0/7. 2. Failure to progress. 3. Induction of labor due to gestational hypertension. 4. Cephalopelvic disproportion (occiput transverse).  PROCEDURE:  Primary low transverse cesarean section with 2-layer closure.  SURGEON:  Pieter Partridge, MD  PHYSICIAN ASSISTANT:  Technician.  ANESTHESIA:  Epidural.  EBL:  700.  URINE:  200 mL of clear urine at end of procedure.  BLOOD ADMINISTERED:  None.  DRAINS:  Foley catheter.  SPECIMEN:  None.  DISPOSITION:  To PACU, hemodynamically stable.  COMPLICATIONS:  None.  FINDINGS:  Viable female infant in the LOT position.  Apgars 8 and 8. Weight 7 pounds 11 ounces.  Normal uterus.  Spontaneous cry on surgical field.  INDICATIONS:  Shirley Jennings is a 29 year old, gravida 1, who was admitted at 38-5/7 weeks due to sudden elevation in blood pressure.  Protein- creatinine ratio was not in the preeclamptic range twice.  Induction of labor was started with Cytotec due to what sounds like cervical scarring.  The patient's cervix was not favorable for Foley bulb placement.  On the evening of the second day, the patient started to have signs of labor.  Blood pressure remained good.  She was given Stadol for pain while awaiting labor to kick in, and she did finally open her cervix and she got to 3 cm; however, overnight, she stopped having urine output despite it felt like the baby was on her bladder per the midwife.  Over  the evening, Foley bulb was placed and clear urine was obtained.  She also had a protein-creatinine ratio which was normal at that time; however, her creatinine had gone up to 1.3 at that time, but again the Foley was replaced and the bladder was drained adequately.  The patient had no cervical change and was on Pitocin, and due to the lack of urine output, it was felt that the baby was malpositioned and was not going to descend into the pelvis.  The patient was counseled on primary cesarean section for which she was very eager.  DESCRIPTION OF PROCEDURE:  The patient was taken to the operating room 1.  Epidural anesthesia was optimized.  Time-out was taken.  She was prepped and draped in a normal fashion.  After adequate anesthesia was confirmed, a Pfannenstiel skin incision was made 2 cm above the symphysis pubis and carried down to the underlying layer of the fascia with a Bovie.  The fascial incision was extended laterally.  Bleeding on the rectus muscles was cauterized. Rectus muscles were dissected off the fascia with a curved Mayo scissors fairly well.  The peritoneum was then entered sharply with hemostats and Metzenbaum, making sure that there was no injury to  the bladder.  The bladder was not as high as I expected that it would be.  It was not bulging up to the mid abdomen.  Peritoneum was stretched.  Alexis retractor was placed.  Initially, prior to when I came back of the pelvic exam prior to the call on the C-section, the baby did not feel as low; however, before I made my uterine incision, I felt that the vertex was low.  I called for a hand from below just in case of having to call for a vacuum.  We waited a little bit to allow the nurses to prepare to place the patient in the position so that a vaginal hand could be placed and the belt was loosened on the leg.  The uterine incision was made in a transverse fashion with a scalpel and extended with the bandage scissors.   Shortly afterwards, clear fluid was noted.  I did see ears at the incision.  I was able to get my hand to the tip of the head, but there was a very strong suction and I could not release it.  I walked to the other side of the bed and asked for a hand and still was unable to deliver the head.  I then went back to the other side, and I was able to release the suction and deliver the baby through the incision.  Vacuum was not used.  Nose and mouth were suctioned.  No nuchal cord noted.  Baby did not have a lot of extensive caput or bruising.  Baby was pulled out and had a spontaneous cry on the field. The baby was handed off to the awaiting NICU Team.  Cord blood was obtained.  The placenta was then delivered manually. Uterus was cleared of all clots and debris.  The uterus was tagged with ring forceps.  Bleeding was minimized.  The incision  was then reapproximated with 0 Vicryl in a continuous locked fashion.  Same suture was used for imbrication although few stitches were used for hemostasis.  The bladder appeared normal, and I actually could not visualize the bladder in the field.  A moist laparotomy sponge had been placed into the left gutter to retract bowel and omentum.  Copious irrigation was performed and then Interceed was placed and then the lap was removed.  Sponge count was correct prior to closure of the peritoneum.  I attempted to close the peritoneum, but it was not intact on the left side, so I did a U-stitch of the rectus muscles and the peritoneum with loose approximation.  The fascia was then reapproximated with 0 Vicryl in a continuous running fashion.  Plain gut was used to low in the subcutaneous space and then the incision was reapproximated with 4-0 Vicryl on a Keith needle.  All instrument, sponge, and needle counts were correct x3.  The patient received antibiotics prior to surgery.  Baby remained in the OR with mom and dad in great condition.     Pieter PartridgeEvelyn Jennings  Shirley Trembath, MD     EBV/MEDQ  D:  07/23/2014  T:  07/24/2014  Job:  [id number]

## 2014-07-28 ENCOUNTER — Telehealth: Payer: Self-pay

## 2014-07-28 NOTE — Telephone Encounter (Signed)
Patient call reporting headache since yesterday.  Reports taking ibuprofen at 1030pm and 0600am with no relief.  Patient denies visual disturbances, fever, pain, SOB, edema, and numbness/tingling.  Patient reports started on HTN medications on Friday.  Patient instructed to observe HA for 2 hours s/p 2nd dose and come in to MAU if it remains the same, increases in intensity, or if other symptoms arise. Patient verbalized understanding.  No other questions or concerns. JE, CNM

## 2015-08-12 ENCOUNTER — Other Ambulatory Visit (HOSPITAL_COMMUNITY)
Admission: RE | Admit: 2015-08-12 | Discharge: 2015-08-12 | Disposition: A | Discharge status: Home / Self Care | Payer: BC Managed Care – PPO | Source: Ambulatory Visit | Attending: Obstetrics and Gynecology | Admitting: Obstetrics and Gynecology

## 2015-08-12 ENCOUNTER — Other Ambulatory Visit: Payer: Self-pay | Admitting: Obstetrics and Gynecology

## 2015-08-12 DIAGNOSIS — Z1151 Encounter for screening for human papillomavirus (HPV): Secondary | ICD-10-CM | POA: Diagnosis present

## 2015-08-12 DIAGNOSIS — Z01419 Encounter for gynecological examination (general) (routine) without abnormal findings: Secondary | ICD-10-CM | POA: Diagnosis present

## 2015-08-13 LAB — CYTOLOGY - PAP

## 2016-06-15 IMAGING — US US OB COMP +14 WK
1 series · 12 of 28 positions shown · non-contrast
Comparison: none

[Series 1: us ob comp +14 wk · 0.22mm/px · 12 of 48 slices shown]
[im 2/48]
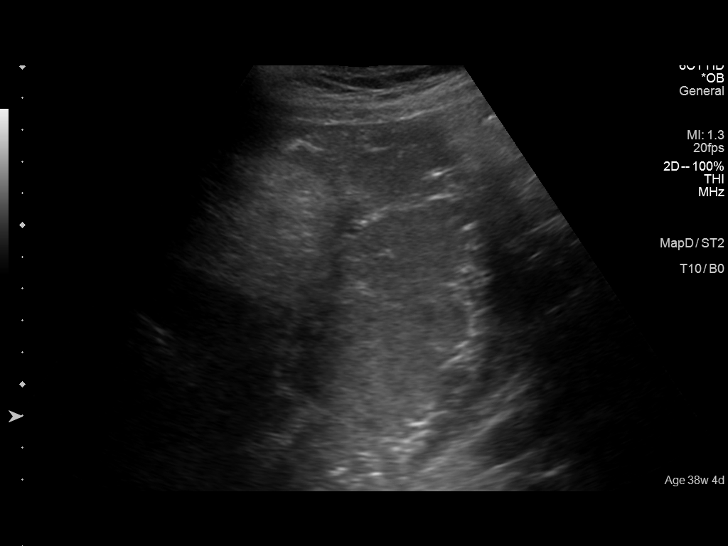
[im 6/48]
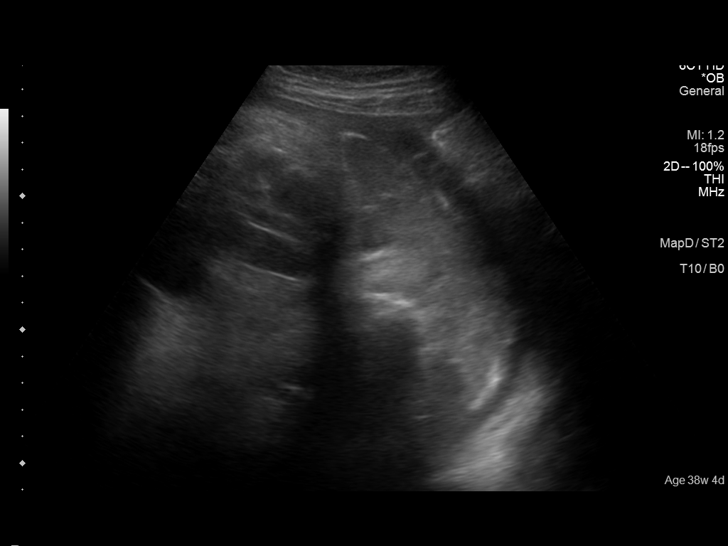
[im 9/48]
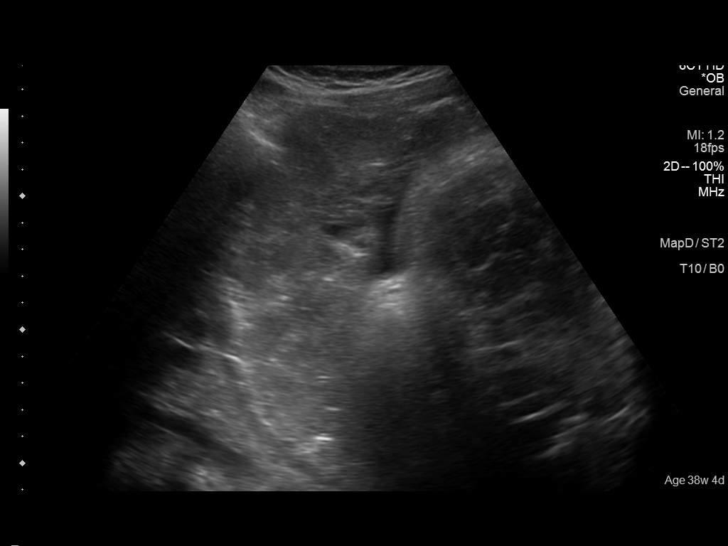
[im 14/48]
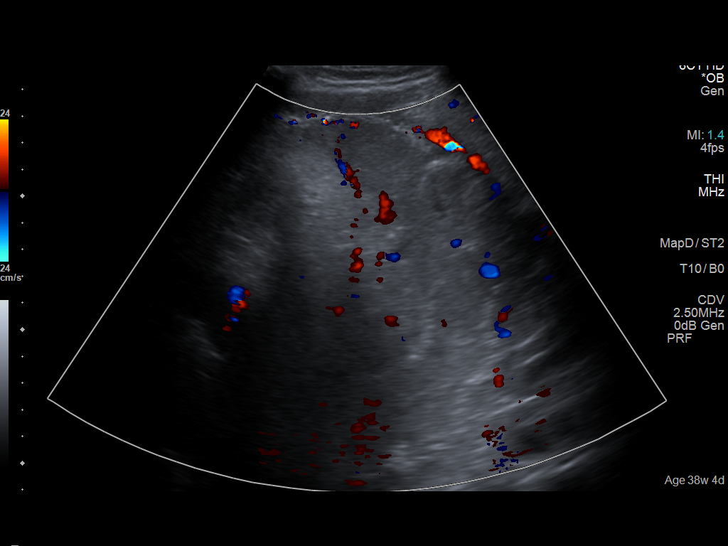
[im 18/48]
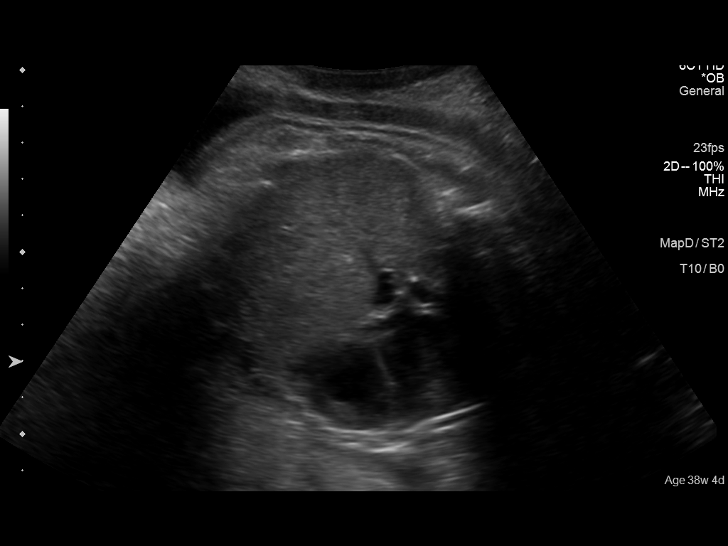
[im 21/48]
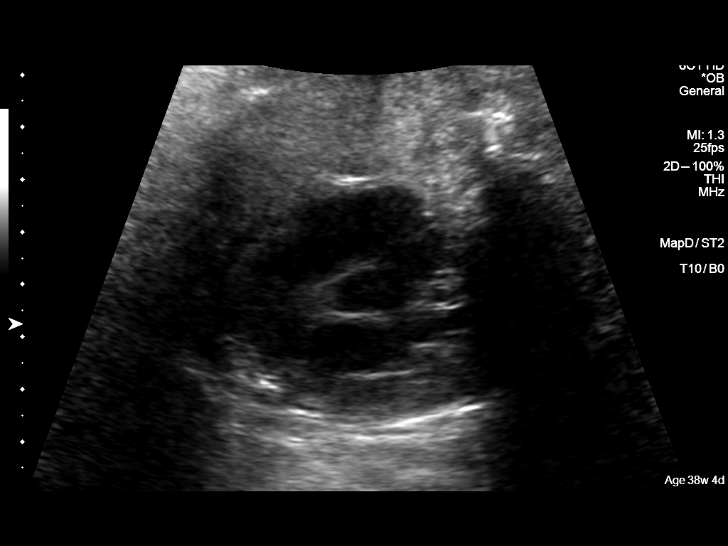
[im 27/48]
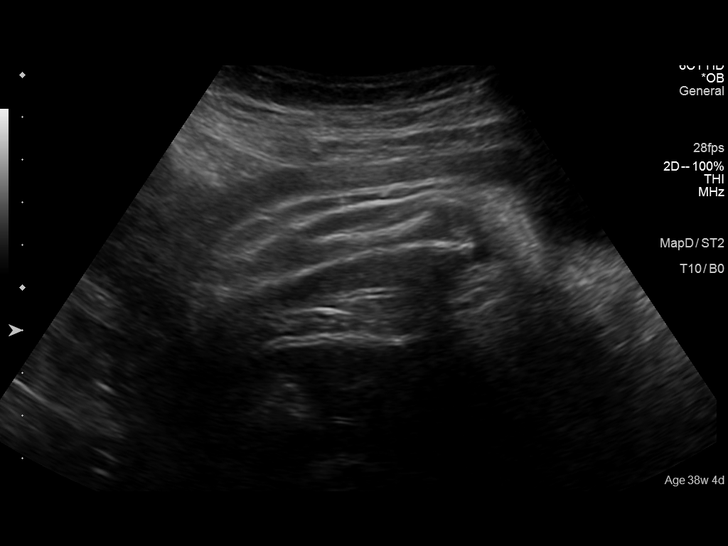
[im 30/48]
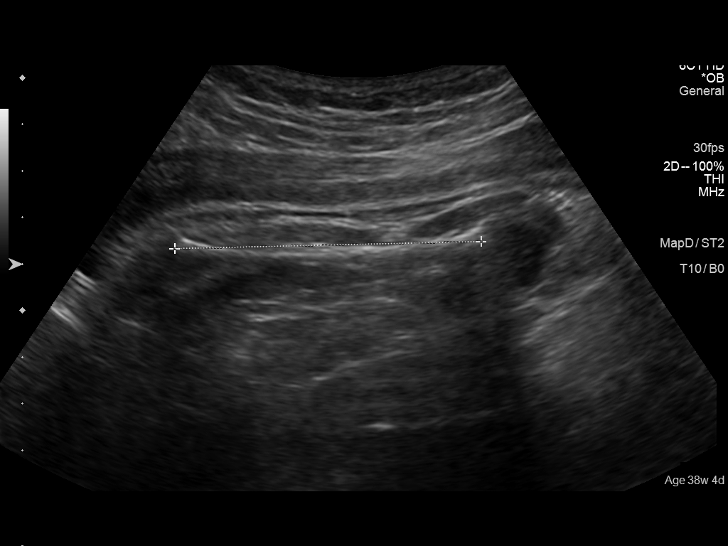
[im 34/48]
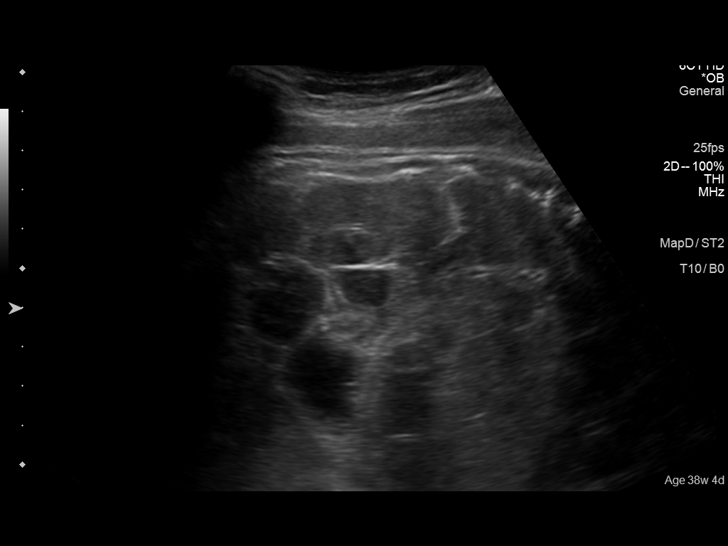
[im 39/48]
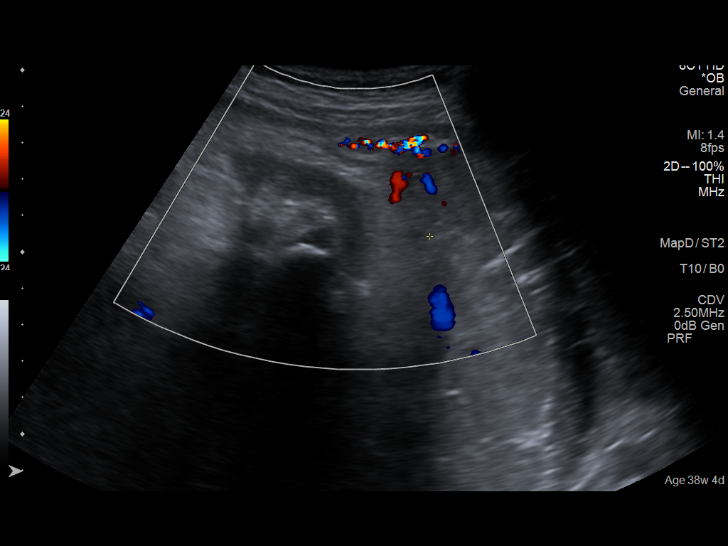
[im 42/48]
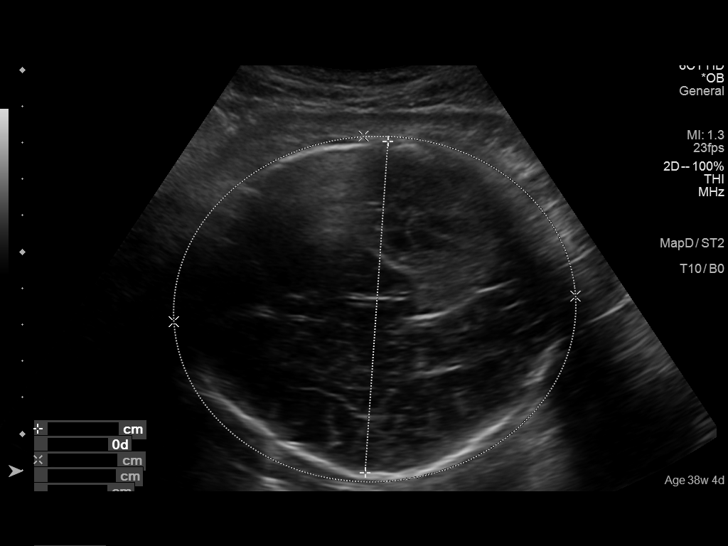
[im 46/48]
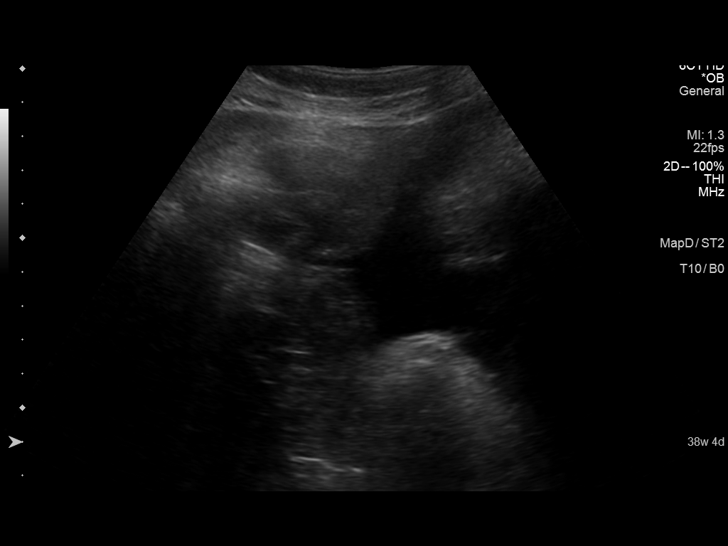

[12 of 28 positions shown; findings below may reference images not displayed]

OBSTETRICS REPORT
                      (Signed Final 07/16/2014 [DATE])

Service(s) Provided

 US OB COMP + 14 WK                                    76805.1
Indications

 38 weeks gestation of pregnancy
 Hypertension - Gestational
 Pre-eclampsia
Fetal Evaluation

 Num Of Fetuses:    1
 Fetal Heart Rate:  157                          bpm
 Cardiac Activity:  Observed
 Presentation:      Cephalic
 Placenta:          Posterior, above cervical
                    os
 P. Cord            Visualized
 Insertion:

 Amniotic Fluid
 AFI FV:      Subjectively within normal limits
 AFI Sum:     7.59    cm        8  %Tile     Larg Pckt:    3.32  cm
 RUQ:   1.97    cm   RLQ:    0      cm    LUQ:   3.32    cm   LLQ:    2.3    cm
Biometry

 BPD:     90.4  mm     G. Age:  36w 5d                CI:        77.99   70 - 86
                                                      FL/HC:      23.3   20.6 -

 HC:     323.9  mm     G. Age:  36w 5d        5  %    HC/AC:      0.94   0.87 -

 AC:     343.3  mm     G. Age:  38w 2d       61  %    FL/BPD:     83.5   71 - 87
 FL:      75.5  mm     G. Age:  38w 5d       56  %    FL/AC:      22.0   20 - 24
 HUM:     66.3  mm     G. Age:  38w 3d       74  %
 Est. FW:    1193  gm      7 lb 6 oz     70  %
Gestational Age

 LMP:           49w 1d        Date:  08/05/13                 EDD:   05/12/14
 Clinical EDD:  38w 4d                                        EDD:   07/25/14
 U/S Today:     37w 4d                                        EDD:   08/01/14
 Best:          38w 4d     Det. By:  Clinical EDD             EDD:   07/25/14
Anatomy

 Cranium:          Appears normal         Aortic Arch:      Not well visualized
 Fetal Cavum:      Not well visualized    Ductal Arch:      Not well visualized
 Ventricles:       Appears normal         Diaphragm:        Not well visualized
 Choroid Plexus:   Not well visualized    Stomach:          Appears normal, left
                                                            sided
 Cerebellum:       Not well visualized    Abdomen:          Appears normal
 Posterior Fossa:  Not well visualized    Abdominal Wall:   Not well visualized
 Nuchal Fold:      Not applicable (>20    Cord Vessels:     Not well visualized
                   wks GA)
 Face:             Not well visualized    Kidneys:          Appear normal
 Lips:             Not well visualized    Bladder:          Appears normal
 Heart:            Not well visualized    Spine:            Not well visualized
 RVOT:             Appears normal         Lower             Not well visualized
                                          Extremities:
 LVOT:             Not well visualized    Upper             Visualized
                                          Extremities:

 Other:  Complete fetal anatomic survey previously performed. Technically
         difficult due to advanced GA and fetal position.
Cervix Uterus Adnexa

 Cervix:       Not visualized (advanced GA >91wks)
 Uterus:       No abnormality visualized.
 Cul De Sac:   No free fluid seen.
 Left Ovary:    Not visualized.
 Right Ovary:   Not visualized.
 Adnexa:     No abnormality visualized.
Impression

 Single IUP at 38w 4d
 Preeclampsia
 Limited views of the fetal anatomy obtained due to advanced
 gestational age
 Fetal growth is appropiate (70th %tile)
 Normal amniotic fluid volume
Recommendations

 Follow-up ultrasounds as clinically indicated.

 questions or concerns.

## 2018-10-23 ENCOUNTER — Other Ambulatory Visit (HOSPITAL_COMMUNITY)
Admission: RE | Admit: 2018-10-23 | Discharge: 2018-10-23 | Disposition: A | Discharge status: Home / Self Care | Payer: BC Managed Care – PPO | Source: Ambulatory Visit | Attending: Obstetrics and Gynecology | Admitting: Obstetrics and Gynecology

## 2018-10-23 ENCOUNTER — Other Ambulatory Visit: Payer: Self-pay | Admitting: Obstetrics and Gynecology

## 2018-10-23 DIAGNOSIS — Z124 Encounter for screening for malignant neoplasm of cervix: Secondary | ICD-10-CM | POA: Diagnosis not present

## 2018-10-26 LAB — CYTOLOGY - PAP
Diagnosis: NEGATIVE
HPV: NOT DETECTED

## 2019-08-13 ENCOUNTER — Ambulatory Visit: Payer: BC Managed Care – PPO | Attending: Internal Medicine

## 2019-08-13 DIAGNOSIS — Z23 Encounter for immunization: Secondary | ICD-10-CM

## 2019-08-13 NOTE — Progress Notes (Signed)
   Covid-19 Vaccination Clinic  Name:  SANJANA FOLZ    MRN: [id number] DOB: 12-24-1985  08/13/2019  Ms. Petrea was observed post Covid-19 immunization for 15 minutes without incident. She was provided with Vaccine Information Sheet and instruction to access the V-Safe system.   Ms. Rix was instructed to call 911 with any severe reactions post vaccine: Marland Kitchen Difficulty breathing  . Swelling of face and throat  . A fast heartbeat  . A bad rash all over body  . Dizziness and weakness   Immunizations Administered    Name Date Dose VIS Date Route   Pfizer COVID-19 Vaccine 08/13/2019 11:33 AM 0.3 mL 04/27/2019 Intramuscular   Manufacturer: [employer], [employer]   Lot: [id number]   NDC: [address]-7

## 2019-09-04 ENCOUNTER — Ambulatory Visit: Payer: BC Managed Care – PPO | Attending: Internal Medicine

## 2019-09-04 DIAGNOSIS — Z23 Encounter for immunization: Secondary | ICD-10-CM

## 2019-09-04 NOTE — Progress Notes (Signed)
   Covid-19 Vaccination Clinic  Name:  Shirley Jennings    MRN: [id number] DOB: [birth year]  09/04/2019  [name] was observed post Covid-19 immunization for 15 minutes without incident. She was provided with Vaccine Information Sheet and instruction to access the V-Safe system.   [name] was instructed to call 911 with any severe reactions post vaccine: Marland Kitchen Difficulty breathing  . Swelling of face and throat  . A fast heartbeat  . A bad rash all over body  . Dizziness and weakness   Immunizations Administered    Name Date Dose VIS Date Route   Pfizer COVID-19 Vaccine 09/04/2019 12:10 PM 0.3 mL 07/11/2018 Intramuscular   Manufacturer: [employer], [employer]   Lot: [id number]   NDC: [address]-5
